# Patient Record
Sex: Female | Born: 1950
Health system: Southern US, Community
[De-identification: ages and names within clinical notes are randomized; demographics above are authoritative.]

## PROBLEM LIST (undated history)

## (undated) DIAGNOSIS — R7303 Prediabetes: Secondary | ICD-10-CM

## (undated) DIAGNOSIS — E785 Hyperlipidemia, unspecified: Secondary | ICD-10-CM

## (undated) HISTORY — DX: Prediabetes: R73.03

## (undated) HISTORY — DX: Hyperlipidemia, unspecified: E78.5

## (undated) HISTORY — PX: TUBAL LIGATION: SHX77

---

## 1998-12-08 ENCOUNTER — Ambulatory Visit (HOSPITAL_COMMUNITY): Admission: RE | Admit: 1998-12-08 | Discharge: 1998-12-08 | Payer: Self-pay | Admitting: Gastroenterology

## 1999-01-25 ENCOUNTER — Encounter: Payer: Self-pay | Admitting: Obstetrics & Gynecology

## 1999-01-25 ENCOUNTER — Ambulatory Visit (HOSPITAL_COMMUNITY): Admission: RE | Admit: 1999-01-25 | Discharge: 1999-01-25 | Payer: Self-pay | Admitting: Obstetrics & Gynecology

## 2000-02-13 ENCOUNTER — Other Ambulatory Visit: Admission: RE | Admit: 2000-02-13 | Discharge: 2000-02-13 | Payer: Self-pay | Admitting: Obstetrics and Gynecology

## 2000-02-19 ENCOUNTER — Ambulatory Visit (HOSPITAL_COMMUNITY): Admission: RE | Admit: 2000-02-19 | Discharge: 2000-02-19 | Payer: Self-pay | Admitting: Obstetrics and Gynecology

## 2000-02-19 ENCOUNTER — Encounter: Payer: Self-pay | Admitting: Obstetrics and Gynecology

## 2000-02-28 ENCOUNTER — Ambulatory Visit (HOSPITAL_COMMUNITY): Admission: RE | Admit: 2000-02-28 | Discharge: 2000-02-28 | Payer: Self-pay | Admitting: Obstetrics and Gynecology

## 2000-02-28 ENCOUNTER — Encounter: Payer: Self-pay | Admitting: Obstetrics and Gynecology

## 2001-05-14 ENCOUNTER — Encounter: Payer: Self-pay | Admitting: Obstetrics and Gynecology

## 2001-05-14 ENCOUNTER — Ambulatory Visit (HOSPITAL_COMMUNITY): Admission: RE | Admit: 2001-05-14 | Discharge: 2001-05-14 | Payer: Self-pay | Admitting: Obstetrics and Gynecology

## 2002-05-18 ENCOUNTER — Encounter: Payer: Self-pay | Admitting: Obstetrics and Gynecology

## 2002-05-18 ENCOUNTER — Ambulatory Visit (HOSPITAL_COMMUNITY): Admission: RE | Admit: 2002-05-18 | Discharge: 2002-05-18 | Payer: Self-pay | Admitting: Obstetrics and Gynecology

## 2003-06-17 ENCOUNTER — Ambulatory Visit (HOSPITAL_COMMUNITY): Admission: RE | Admit: 2003-06-17 | Discharge: 2003-06-17 | Payer: Self-pay | Admitting: Obstetrics and Gynecology

## 2003-06-17 ENCOUNTER — Encounter: Payer: Self-pay | Admitting: Obstetrics and Gynecology

## 2003-06-27 ENCOUNTER — Other Ambulatory Visit: Admission: RE | Admit: 2003-06-27 | Discharge: 2003-06-27 | Payer: Self-pay | Admitting: Obstetrics and Gynecology

## 2004-07-20 ENCOUNTER — Ambulatory Visit (HOSPITAL_COMMUNITY): Admission: RE | Admit: 2004-07-20 | Discharge: 2004-07-20 | Payer: Self-pay | Admitting: Obstetrics and Gynecology

## 2004-09-07 ENCOUNTER — Other Ambulatory Visit: Admission: RE | Admit: 2004-09-07 | Discharge: 2004-09-07 | Payer: Self-pay | Admitting: Obstetrics and Gynecology

## 2005-08-23 ENCOUNTER — Ambulatory Visit (HOSPITAL_COMMUNITY): Admission: RE | Admit: 2005-08-23 | Discharge: 2005-08-23 | Payer: Self-pay | Admitting: Obstetrics and Gynecology

## 2005-09-24 ENCOUNTER — Other Ambulatory Visit: Admission: RE | Admit: 2005-09-24 | Discharge: 2005-09-24 | Payer: Self-pay | Admitting: Obstetrics and Gynecology

## 2006-12-19 ENCOUNTER — Ambulatory Visit (HOSPITAL_COMMUNITY): Admission: RE | Admit: 2006-12-19 | Discharge: 2006-12-19 | Payer: Self-pay | Admitting: Obstetrics and Gynecology

## 2007-12-25 ENCOUNTER — Ambulatory Visit (HOSPITAL_COMMUNITY): Admission: RE | Admit: 2007-12-25 | Discharge: 2007-12-25 | Payer: Self-pay | Admitting: Obstetrics and Gynecology

## 2009-01-20 ENCOUNTER — Ambulatory Visit (HOSPITAL_COMMUNITY): Admission: RE | Admit: 2009-01-20 | Discharge: 2009-01-20 | Payer: Self-pay | Admitting: Obstetrics and Gynecology

## 2010-03-30 ENCOUNTER — Ambulatory Visit (HOSPITAL_COMMUNITY)
Admission: RE | Admit: 2010-03-30 | Discharge: 2010-03-30 | Payer: Self-pay | Source: Home / Self Care | Admitting: Obstetrics and Gynecology

## 2011-04-03 ENCOUNTER — Other Ambulatory Visit (HOSPITAL_COMMUNITY): Payer: Self-pay | Admitting: Obstetrics and Gynecology

## 2011-04-03 DIAGNOSIS — Z1231 Encounter for screening mammogram for malignant neoplasm of breast: Secondary | ICD-10-CM

## 2011-04-19 ENCOUNTER — Ambulatory Visit (HOSPITAL_COMMUNITY)
Admission: RE | Admit: 2011-04-19 | Discharge: 2011-04-19 | Disposition: A | Discharge status: Home / Self Care | Payer: PRIVATE HEALTH INSURANCE | Source: Ambulatory Visit | Attending: Obstetrics and Gynecology | Admitting: Obstetrics and Gynecology

## 2011-04-19 DIAGNOSIS — Z1231 Encounter for screening mammogram for malignant neoplasm of breast: Secondary | ICD-10-CM | POA: Insufficient documentation

## 2012-05-11 ENCOUNTER — Other Ambulatory Visit (HOSPITAL_COMMUNITY): Payer: Self-pay | Admitting: Obstetrics and Gynecology

## 2012-05-11 DIAGNOSIS — Z1231 Encounter for screening mammogram for malignant neoplasm of breast: Secondary | ICD-10-CM

## 2012-06-05 ENCOUNTER — Ambulatory Visit (HOSPITAL_COMMUNITY)
Admission: RE | Admit: 2012-06-05 | Discharge: 2012-06-05 | Disposition: A | Discharge status: Home / Self Care | Payer: BC Managed Care – PPO | Source: Ambulatory Visit | Attending: Obstetrics and Gynecology | Admitting: Obstetrics and Gynecology

## 2012-06-05 DIAGNOSIS — Z1231 Encounter for screening mammogram for malignant neoplasm of breast: Secondary | ICD-10-CM

## 2012-09-01 ENCOUNTER — Encounter: Payer: Self-pay | Admitting: Emergency Medicine

## 2012-09-01 ENCOUNTER — Ambulatory Visit (INDEPENDENT_AMBULATORY_CARE_PROVIDER_SITE_OTHER): Payer: BC Managed Care – PPO | Admitting: Emergency Medicine

## 2012-09-01 ENCOUNTER — Ambulatory Visit: Payer: BC Managed Care – PPO

## 2012-09-01 VITALS — BP 120/78 | HR 73 | Temp 98.1°F | Resp 17 | Ht 62.0 in | Wt 167.0 lb

## 2012-09-01 DIAGNOSIS — Z720 Tobacco use: Secondary | ICD-10-CM | POA: Insufficient documentation

## 2012-09-01 DIAGNOSIS — D219 Benign neoplasm of connective and other soft tissue, unspecified: Secondary | ICD-10-CM

## 2012-09-01 DIAGNOSIS — Z0000 Encounter for general adult medical examination without abnormal findings: Secondary | ICD-10-CM

## 2012-09-01 DIAGNOSIS — Z780 Asymptomatic menopausal state: Secondary | ICD-10-CM

## 2012-09-01 DIAGNOSIS — L739 Follicular disorder, unspecified: Secondary | ICD-10-CM

## 2012-09-01 DIAGNOSIS — F17200 Nicotine dependence, unspecified, uncomplicated: Secondary | ICD-10-CM | POA: Insufficient documentation

## 2012-09-01 DIAGNOSIS — E785 Hyperlipidemia, unspecified: Secondary | ICD-10-CM | POA: Insufficient documentation

## 2012-09-01 DIAGNOSIS — Z23 Encounter for immunization: Secondary | ICD-10-CM

## 2012-09-01 LAB — POCT UA - MICROSCOPIC ONLY
Casts, Ur, LPF, POC: NEGATIVE
Crystals, Ur, HPF, POC: NEGATIVE
Mucus, UA: NEGATIVE
RBC, urine, microscopic: NEGATIVE
Yeast, UA: NEGATIVE

## 2012-09-01 LAB — COMPREHENSIVE METABOLIC PANEL
ALT: 9 U/L (ref 0–35)
AST: 10 U/L (ref 0–37)
Albumin: 4.4 g/dL (ref 3.5–5.2)
Alkaline Phosphatase: 72 U/L (ref 39–117)
BUN: 11 mg/dL (ref 6–23)
CO2: 24 mEq/L (ref 19–32)
Calcium: 8.9 mg/dL (ref 8.4–10.5)
Chloride: 110 mEq/L (ref 96–112)
Creat: 0.79 mg/dL (ref 0.50–1.10)
Glucose, Bld: 92 mg/dL (ref 70–99)
Potassium: 4.3 mEq/L (ref 3.5–5.3)
Sodium: 141 mEq/L (ref 135–145)
Total Bilirubin: 0.4 mg/dL (ref 0.3–1.2)
Total Protein: 6.7 g/dL (ref 6.0–8.3)

## 2012-09-01 LAB — CBC WITH DIFFERENTIAL/PLATELET
Basophils Absolute: 0.1 10*3/uL (ref 0.0–0.1)
Basophils Relative: 1 % (ref 0–1)
Eosinophils Absolute: 0.2 10*3/uL (ref 0.0–0.7)
Eosinophils Relative: 2 % (ref 0–5)
HCT: 41.1 % (ref 36.0–46.0)
Hemoglobin: 14.4 g/dL (ref 12.0–15.0)
Lymphocytes Relative: 28 % (ref 12–46)
Lymphs Abs: 2.3 10*3/uL (ref 0.7–4.0)
MCH: 31.2 pg (ref 26.0–34.0)
MCHC: 35 g/dL (ref 30.0–36.0)
MCV: 89 fL (ref 78.0–100.0)
Monocytes Absolute: 0.6 10*3/uL (ref 0.1–1.0)
Monocytes Relative: 8 % (ref 3–12)
Neutro Abs: 5.1 10*3/uL (ref 1.7–7.7)
Neutrophils Relative %: 61 % (ref 43–77)
Platelets: 281 10*3/uL (ref 150–400)
RBC: 4.62 MIL/uL (ref 3.87–5.11)
RDW: 13.6 % (ref 11.5–15.5)
WBC: 8.3 10*3/uL (ref 4.0–10.5)

## 2012-09-01 LAB — LIPID PANEL
Cholesterol: 155 mg/dL (ref 0–200)
HDL: 43 mg/dL (ref >39)
LDL Cholesterol: 93 mg/dL (ref 0–99)
Total CHOL/HDL Ratio: 3.6 Ratio
Triglycerides: 94 mg/dL (ref <150)
VLDL: 19 mg/dL (ref 0–40)

## 2012-09-01 LAB — POCT URINALYSIS DIPSTICK
Bilirubin, UA: NEGATIVE
Blood, UA: NEGATIVE
Glucose, UA: NEGATIVE
Ketones, UA: NEGATIVE
Leukocytes, UA: NEGATIVE
Nitrite, UA: NEGATIVE
Protein, UA: NEGATIVE
Spec Grav, UA: 1.015
Urobilinogen, UA: 0.2
pH, UA: 6.5

## 2012-09-01 LAB — IFOBT (OCCULT BLOOD): IFOBT: NEGATIVE

## 2012-09-01 LAB — TSH: TSH: 3.35 u[IU]/mL (ref 0.350–4.500)

## 2012-09-01 MED ORDER — SIMVASTATIN 40 MG PO TABS: 40.0000 mg | ORAL_TABLET | Freq: Every evening | ORAL | Status: DC

## 2012-09-01 MED ORDER — MUPIROCIN CALCIUM 2 % EX CREA
TOPICAL_CREAM | Freq: Three times a day (TID) | CUTANEOUS | Status: DC
Start: 2012-09-01 — End: 2014-11-03

## 2012-09-01 MED ORDER — VARENICLINE TARTRATE 1 MG PO TABS: 1.0000 mg | ORAL_TABLET | Freq: Two times a day (BID) | ORAL | Status: DC

## 2012-09-01 MED ORDER — CONJ ESTROG-MEDROXYPROGEST ACE 0.3-1.5 MG PO TABS: 1.0000 | ORAL_TABLET | Freq: Every day | ORAL | Status: DC

## 2012-09-01 NOTE — Progress Notes (Addendum)
@[email or web address] @  Patient ID: Jennifer Marquez MRN: [id number], DOB: 1951/05/13, 61 y.o. Date of Encounter: 09/01/2012, 10:36 AM  Primary Physician: No primary provider on file.  Chief Complaint: Physical (CPE)  HPI: 61 y.o. y/o female with history of noted below here for CPE.  Doing well. No issues/complaints.  LMP:  Pap: MMG: Review of Systems:  Consitutional: No fever, chills, fatigue, night sweats, lymphadenopathy, or weight changes. Eyes: No visual changes, eye redness, or discharge. ENT/Mouth: Ears: No otalgia, tinnitus, hearing loss, discharge. Nose: No congestion, rhinorrhea, sinus pain, or epistaxis. Throat: No sore throat, post nasal drip, or teeth pain. Cardiovascular: No CP, palpitations, diaphoresis, DOE, edema, orthopnea, PND. Respiratory: She has a history of exercise-induced asthma she has Dulera to take but rarely takes it. As a pro-air inhaler to use as  Gastrointestinal: No anorexia, dysphagia, reflux, pain, nausea, vomiting, hematemesis, diarrhea, constipation, BRBPR, or melena. Breast: No discharge, pain, swelling, or mass. Genitourinary: No dysuria, frequency, urgency, hematuria, incontinence, nocturia, amenorrhea, vaginal discharge, pruritis, burning, abnormal bleeding, or pain. Musculoskeletal: No decreased ROM, myalgias, stiffness, joint swelling, or weakness. Skin: No rash, erythema, lesion changes, pain, warmth, jaundice, or pruritis. Neurological: No headache, dizziness, syncope, seizures, tremors, memory loss, coordination problems, or paresthesias. Psychological: No anxiety, depression, hallucinations, SI/HI. Endocrine: No fatigue, polydipsia, polyphagia, polyuria, or known diabetes. All other systems were reviewed and are otherwise negative.  History reviewed. No pertinent past medical history.   Past Surgical History  Procedure Date  . Tubal ligation     Home Meds:  Prior to Admission medications   Medication Sig Start Date End Date Taking?  Authorizing Provider  estrogen, conjugated,-medroxyprogesterone (PREMPRO) 0.45-1.5 MG per tablet Take 1 tablet by mouth daily.   Yes Historical Provider, MD  simvastatin (ZOCOR) 40 MG tablet Take 40 mg by mouth every evening.   Yes Historical Provider, MD    Allergies: Not on File  History   Social History  . Marital Status: Married    Spouse Name: N/A    Number of Children: N/A  . Years of Education: N/A   Occupational History  . Not on file.   Social History Main Topics  . Smoking status: Current Every Day Smoker -- 0.1 packs/day  . Smokeless tobacco: Never Used     Comment: would like to discuss  . Alcohol Use: No  . Drug Use: No  . Sexually Active: Not on file   Other Topics Concern  . Not on file   Social History Narrative  . No narrative on file    Family History  Problem Relation Age of Onset  . Hypertension Mother   . Cancer Father     Physical Exam  Blood pressure 120/78, pulse 73, temperature 98.1 F (36.7 C), temperature source Oral, resp. rate 17, height 5\' 2"  (1.575 m), weight 167 lb (75.751 kg), SpO2 98.00%., Body mass index is 30.54 kg/(m^2). General: Well developed, well nourished, in no acute distress. HEENT: Normocephalic, atraumatic. Conjunctiva pink, sclera non-icteric. Pupils 2 mm constricting to 1 mm, round, regular, and equally reactive to light and accomodation. EOMI. Internal auditory canal clear. TMs with good cone of light and without pathology. Nasal mucosa pink. Nares are without discharge. No sinus tenderness. Oral mucosa pink. Dentition normal . Pharynx without exudate.   Neck: Supple. Trachea midline. No thyromegaly. Full ROM. No lymphadenopathy. Lungs: Clear to auscultation bilaterally without wheezes, rales, or rhonchi. Breathing is of normal effort and unlabored. Cardiovascular: RRR with S1 S2. No murmurs, rubs, or gallops appreciated. Distal  pulses 2+ symmetrically. No carotid or abdominal bruits.  Breast: There are no masses  palpable nipples are normal. There is no retraction noted there no skin changes noted  Abdomen: Soft, non-tender, non-distended with normoactive bowel sounds. No hepatosplenomegaly or masses. No rebound/guarding. No CVA tenderness. Without hernias.  Genitourinary:  There areas of folliculitis in the perineal and mons pubis areas. The vulva appear normal uterus is enlarged approximately 12 weeks size and irregular. Cervix itself looks normal.   Musculoskeletal: Full range of motion and 5/5 strength throughout. Without swelling, atrophy, tenderness, crepitus, or warmth. Extremities without clubbing, cyanosis, or edema. Calves supple. Skin: Warm and moist. There are multiple small pimple areas in the vaginal area  Neuro: A+Ox3. CN II-XII grossly intact. Moves all extremities spontaneously. Full sensation throughout. Normal gait. DTR 2+ throughout upper and lower extremities. Finger to nose intact. Psych:  Responds to questions appropriately with a normal affect.    [facility] reading (PRIMARY) by  Dr Cleta Alberts no acute disease      Assessment/Plan:  61 y.o. y/o female here for CPE patient has five factors for heart disease. On the lower dose of hormone. She is given a prescription for Chantix to see if we can help get her to quit smoking. She is up-to-date on her mammograms routine labs and Pap were done. She was updated on her tetanus today. She's been referred to [name]. for his evaluation for heart disease  -  Signed, Earl Lites, MD 09/01/2012 10:36 AM

## 2012-09-02 ENCOUNTER — Telehealth: Payer: Self-pay | Admitting: Emergency Medicine

## 2012-09-02 LAB — PAP IG (IMAGE GUIDED)

## 2012-09-02 LAB — VITAMIN D 25 HYDROXY (VIT D DEFICIENCY, FRACTURES): Vit D, 25-Hydroxy: 16 ng/mL — ABNORMAL LOW (ref 30–89)

## 2012-09-02 NOTE — Telephone Encounter (Signed)
Please call patient and let her know there were scant cells seen on the Pap smear due to her being in menopause. There were no abnormal cells seen

## 2012-09-02 NOTE — Telephone Encounter (Signed)
Patient advised.

## 2012-09-23 ENCOUNTER — Other Ambulatory Visit: Payer: Self-pay | Admitting: Emergency Medicine

## 2012-09-23 ENCOUNTER — Ambulatory Visit: Payer: PRIVATE HEALTH INSURANCE | Admitting: Cardiovascular Disease

## 2012-12-01 ENCOUNTER — Telehealth: Payer: Self-pay

## 2012-12-01 NOTE — Telephone Encounter (Signed)
Patient says Dr. Cleta Alberts lowered the dosage on her hormone PREMPRO and she is experiencing Night Sweats would like it increased.  Wants to talk with nurse first.   Call  [phone]  [facility] on [facility]

## 2012-12-01 NOTE — Telephone Encounter (Signed)
I would prefer that she be on the lowest dose of estrogen possible because of her age and increased risk of heart disease and breast cancer. I would advise she continue to try the lower dose of estrogen for another 4-6 weeks. If she is still having problems at that time we can increase the dosage back on her estrogen

## 2012-12-01 NOTE — Telephone Encounter (Signed)
estrogen, conjugated,-medroxyprogesterone (PREMPRO) 0.45-1.5 MG per tablet was changed to estrogen, conjugated,-medroxyprogesterone (PREMPRO) 0.3-1.5 MG per tablet Please advise, do you want to change this back?

## 2012-12-02 NOTE — Telephone Encounter (Signed)
Called patient to advise Dr Cleta Alberts wants her to give this another try for 4 more weeks, she reluctantly agrees, states she has tried it before, could not take lower dose. She will be in touch if she continues with night sweats.

## 2013-01-22 ENCOUNTER — Other Ambulatory Visit: Payer: Self-pay | Admitting: Internal Medicine

## 2013-01-22 NOTE — Telephone Encounter (Signed)
Spoke w/pt to clarify what she needs. She stated that she does NOT need the Chantix Starter pack (she just finished that). She does need the continuing packs sent to pharmacy. Dr Cleta Alberts, can we send in the continuing packs for pt?

## 2013-01-22 NOTE — Telephone Encounter (Signed)
Yes it is fine to call in the regular pack of chantix. she is to be on 1 mg twice a day #60 with 1 refill

## 2013-01-28 MED ORDER — VARENICLINE TARTRATE 1 MG PO TABS: 1.0000 mg | ORAL_TABLET | Freq: Two times a day (BID) | ORAL | Status: DC

## 2013-01-28 NOTE — Addendum Note (Signed)
Addended by: Sheppard Plumber A on: 01/28/2013 01:44 PM   Modules accepted: Orders

## 2013-01-28 NOTE — Telephone Encounter (Signed)
Sent in Rx for continuing packs.

## 2013-04-07 ENCOUNTER — Telehealth: Payer: Self-pay

## 2013-04-07 NOTE — Telephone Encounter (Signed)
Called her to advise.  

## 2013-04-07 NOTE — Telephone Encounter (Signed)
DR.DAUB, PT WOULD LIKE TO INCREASE HER DOSAGE ON PREMPRO BECAUSE SHE IS STILL EXPERIENCING NIGHT SWEATS AND HOT FLASHES.  PHARMACY:[facility][phone]

## 2013-04-07 NOTE — Telephone Encounter (Signed)
It is not safe for her to increase the dose because of the increased risk of breast cancer  and heart disease. I would suggest she talk to the pharmacist about herbal alternatives to help with the symptoms she is having. I would also be happy to see her in the office and talk with her about alternatives but I do not think that increasing the dose on her hormone is safe

## 2013-06-24 ENCOUNTER — Other Ambulatory Visit: Payer: Self-pay | Admitting: Physician Assistant

## 2013-06-25 NOTE — Telephone Encounter (Signed)
Needs ov, labs 

## 2013-06-29 ENCOUNTER — Other Ambulatory Visit (HOSPITAL_COMMUNITY): Payer: Self-pay | Admitting: Obstetrics and Gynecology

## 2013-06-29 DIAGNOSIS — Z1231 Encounter for screening mammogram for malignant neoplasm of breast: Secondary | ICD-10-CM

## 2013-07-02 ENCOUNTER — Ambulatory Visit (HOSPITAL_COMMUNITY)
Admission: RE | Admit: 2013-07-02 | Discharge: 2013-07-02 | Disposition: A | Discharge status: Home / Self Care | Payer: BC Managed Care – PPO | Source: Ambulatory Visit | Attending: Obstetrics and Gynecology | Admitting: Obstetrics and Gynecology

## 2013-07-02 DIAGNOSIS — Z1231 Encounter for screening mammogram for malignant neoplasm of breast: Secondary | ICD-10-CM | POA: Insufficient documentation

## 2013-07-06 ENCOUNTER — Other Ambulatory Visit: Payer: Self-pay | Admitting: Obstetrics and Gynecology

## 2013-07-06 DIAGNOSIS — R928 Other abnormal and inconclusive findings on diagnostic imaging of breast: Secondary | ICD-10-CM

## 2013-07-20 ENCOUNTER — Other Ambulatory Visit: Payer: Self-pay | Admitting: Obstetrics and Gynecology

## 2013-07-20 ENCOUNTER — Ambulatory Visit
Admission: RE | Admit: 2013-07-20 | Discharge: 2013-07-20 | Disposition: A | Discharge status: Home / Self Care | Payer: BC Managed Care – PPO | Source: Ambulatory Visit | Attending: Obstetrics and Gynecology | Admitting: Obstetrics and Gynecology

## 2013-07-20 DIAGNOSIS — R928 Other abnormal and inconclusive findings on diagnostic imaging of breast: Secondary | ICD-10-CM

## 2013-07-20 DIAGNOSIS — R921 Mammographic calcification found on diagnostic imaging of breast: Secondary | ICD-10-CM

## 2013-07-27 ENCOUNTER — Ambulatory Visit
Admission: RE | Admit: 2013-07-27 | Discharge: 2013-07-27 | Disposition: A | Discharge status: Home / Self Care | Payer: BC Managed Care – PPO | Source: Ambulatory Visit | Attending: Obstetrics and Gynecology | Admitting: Obstetrics and Gynecology

## 2013-07-27 DIAGNOSIS — R921 Mammographic calcification found on diagnostic imaging of breast: Secondary | ICD-10-CM

## 2013-08-12 ENCOUNTER — Other Ambulatory Visit: Payer: Self-pay

## 2013-09-17 ENCOUNTER — Telehealth: Payer: Self-pay | Admitting: Emergency Medicine

## 2013-09-17 ENCOUNTER — Other Ambulatory Visit: Payer: Self-pay | Admitting: Emergency Medicine

## 2013-09-17 NOTE — Telephone Encounter (Signed)
Please call patient and advised her to make an appointment to see me at 104 for her yearly checkup.

## 2013-09-17 NOTE — Telephone Encounter (Signed)
Pt has appt sch for 10/26/13. Can we give her 2 mos RFs to cover until then? I have pended w/2.

## 2013-09-20 NOTE — Telephone Encounter (Signed)
She is scheduled to see you on 10/26/12 for her physical.

## 2013-09-21 ENCOUNTER — Other Ambulatory Visit: Payer: Self-pay | Admitting: Physician Assistant

## 2013-10-26 ENCOUNTER — Ambulatory Visit (INDEPENDENT_AMBULATORY_CARE_PROVIDER_SITE_OTHER): Payer: BC Managed Care – PPO | Admitting: Emergency Medicine

## 2013-10-26 ENCOUNTER — Encounter: Payer: Self-pay | Admitting: Emergency Medicine

## 2013-10-26 ENCOUNTER — Ambulatory Visit: Payer: BC Managed Care – PPO

## 2013-10-26 VITALS — BP 110/78 | HR 61 | Temp 98.7°F | Resp 16 | Ht 62.0 in | Wt 161.0 lb

## 2013-10-26 DIAGNOSIS — Z0000 Encounter for general adult medical examination without abnormal findings: Secondary | ICD-10-CM

## 2013-10-26 DIAGNOSIS — R319 Hematuria, unspecified: Secondary | ICD-10-CM

## 2013-10-26 DIAGNOSIS — Z136 Encounter for screening for cardiovascular disorders: Secondary | ICD-10-CM

## 2013-10-26 LAB — CBC WITH DIFFERENTIAL/PLATELET
Basophils Absolute: 0.1 10*3/uL (ref 0.0–0.1)
Basophils Relative: 1 % (ref 0–1)
Eosinophils Absolute: 0.2 10*3/uL (ref 0.0–0.7)
Eosinophils Relative: 2 % (ref 0–5)
HCT: 44 % (ref 36.0–46.0)
Hemoglobin: 14.8 g/dL (ref 12.0–15.0)
Lymphocytes Relative: 28 % (ref 12–46)
Lymphs Abs: 2.2 10*3/uL (ref 0.7–4.0)
MCH: 31 pg (ref 26.0–34.0)
MCHC: 33.6 g/dL (ref 30.0–36.0)
MCV: 92.1 fL (ref 78.0–100.0)
Monocytes Absolute: 0.5 10*3/uL (ref 0.1–1.0)
Monocytes Relative: 6 % (ref 3–12)
Neutro Abs: 5 10*3/uL (ref 1.7–7.7)
Neutrophils Relative %: 63 % (ref 43–77)
Platelets: 278 10*3/uL (ref 150–400)
RBC: 4.78 MIL/uL (ref 3.87–5.11)
RDW: 13.7 % (ref 11.5–15.5)
WBC: 7.9 10*3/uL (ref 4.0–10.5)

## 2013-10-26 LAB — POCT URINALYSIS DIPSTICK
Bilirubin, UA: NEGATIVE
Glucose, UA: NEGATIVE
Ketones, UA: NEGATIVE
Leukocytes, UA: NEGATIVE
Nitrite, UA: NEGATIVE
Protein, UA: NEGATIVE
Spec Grav, UA: 1.025
Urobilinogen, UA: 0.2
pH, UA: 5.5

## 2013-10-26 LAB — COMPLETE METABOLIC PANEL WITH GFR
ALT: 13 U/L (ref 0–35)
AST: 13 U/L (ref 0–37)
Albumin: 4.3 g/dL (ref 3.5–5.2)
Alkaline Phosphatase: 75 U/L (ref 39–117)
BUN: 16 mg/dL (ref 6–23)
CO2: 27 mEq/L (ref 19–32)
Calcium: 9.1 mg/dL (ref 8.4–10.5)
Chloride: 107 mEq/L (ref 96–112)
Creat: 0.88 mg/dL (ref 0.50–1.10)
GFR, Est African American: 81 mL/min
GFR, Est Non African American: 70 mL/min
Glucose, Bld: 92 mg/dL (ref 70–99)
Potassium: 4.6 mEq/L (ref 3.5–5.3)
Sodium: 141 mEq/L (ref 135–145)
Total Bilirubin: 0.6 mg/dL (ref 0.3–1.2)
Total Protein: 6.6 g/dL (ref 6.0–8.3)

## 2013-10-26 LAB — LIPID PANEL
Cholesterol: 145 mg/dL (ref 0–200)
HDL: 49 mg/dL (ref >39)
LDL Cholesterol: 77 mg/dL (ref 0–99)
Total CHOL/HDL Ratio: 3 Ratio
Triglycerides: 96 mg/dL (ref <150)
VLDL: 19 mg/dL (ref 0–40)

## 2013-10-26 LAB — POCT UA - MICROSCOPIC ONLY
Casts, Ur, LPF, POC: NEGATIVE
Crystals, Ur, HPF, POC: NEGATIVE
Yeast, UA: NEGATIVE

## 2013-10-26 LAB — IFOBT (OCCULT BLOOD): IFOBT: NEGATIVE

## 2013-10-26 MED ORDER — ATORVASTATIN CALCIUM 40 MG PO TABS: ORAL_TABLET | ORAL | Status: DC

## 2013-10-26 MED ORDER — CONJ ESTROG-MEDROXYPROGEST ACE 0.3-1.5 MG PO TABS: ORAL_TABLET | ORAL | Status: DC

## 2013-10-26 MED ORDER — VARENICLINE TARTRATE 0.5 MG X 11 & 1 MG X 42 PO MISC: ORAL | Status: DC

## 2013-10-26 NOTE — Progress Notes (Signed)
   Subjective:    Patient ID: Jennifer Marquez, female    DOB: 07/13/1951, 63 y.o.   MRN: [id number]  HPI    Review of Systems  Constitutional: Negative.   HENT: Negative.   Eyes: Negative.   Respiratory: Negative.   Cardiovascular: Negative.   Gastrointestinal: Negative.   Endocrine: Negative.   Genitourinary: Negative.   Musculoskeletal: Negative.   Skin: Negative.   Allergic/Immunologic: Negative.   Neurological: Negative.   Hematological: Negative.   Psychiatric/Behavioral: Negative.        Objective:   Physical Exam        Assessment & Plan:

## 2013-10-26 NOTE — Progress Notes (Addendum)
[place], [place]  [id number]   [phone]  This chart was scribed for Arlyss Queen, MD by Maree Erie, ED Scribe. The patient was seen in room 22.  Chief Complaint  Patient presents with  . Annual Exam   Patient ID: ANDERA CRANMER MRN: [id number], DOB: 21-Mar-1951, 63 y.o. Date of Encounter: 10/26/2013, 8:21 AM  Primary Physician: No primary provider on file.  Chief Complaint: Physical (CPE)  HPI: 63 y.o. y/o female with history of noted below here for CPE.  Doing well.  She states she is still smoking. She tried Chantix but it made her nauseous so she stopped using it. She is still smoking a half pack and also using an e-cigarette. She needs a new prescription for the starter Chantix pack because she would like to try it again. She has been smoking for about fifteen years. She reports taking her Lipitor compliantly. She is currently on hormones. She states that when she didn't take them for a week her menopausal symptoms came back, mainly night sweats. However, she is open to trying to take them every other day as an attempt to wean off the medication. She denies a family history of breast or uterine cancer. She denies any surgical history. She denies a history of heart problems. She has not been seen by a Cardiologist.  She received her shingles vaccination last year. She has received her flu vaccination this year.   Last colonoscopy: 2010. Her brother had a history of colon cancer. Has not had one recently because the colonoscopy was filed under diagnostic instead of screening so it was denied by insurance.  LMP:  Pap: 08/2012 MMG: September or October, had biopsy that came back normal, cleared until 2015  Review of Systems:  Consitutional: No fever, chills, fatigue, night sweats, lymphadenopathy, or weight changes. Eyes: No visual changes, eye redness, or discharge. ENT/Mouth: Ears: No otalgia, tinnitus, hearing loss, discharge. Nose: No congestion, rhinorrhea,  sinus pain, or epistaxis. Throat: No sore throat, post nasal drip, or teeth pain. Cardiovascular: No CP, palpitations, diaphoresis, DOE, edema, orthopnea, PND. Respiratory: No cough, shortness of breath, wheezing or chest tightness.  Gastrointestinal: No anorexia, dysphagia, reflux, pain, nausea, vomiting, hematemesis, diarrhea, constipation, BRBPR, or melena. Breast: No discharge, pain, swelling, or mass. Genitourinary: No dysuria, frequency, urgency, hematuria, incontinence, nocturia, amenorrhea, vaginal discharge, pruritis, burning, abnormal bleeding, or pain. Musculoskeletal: No decreased ROM, myalgias, stiffness, joint swelling, or weakness. Skin: No rash, erythema, lesion changes, pain, warmth, jaundice, or pruritis. Neurological: No headache, dizziness, syncope, seizures, tremors, memory loss, coordination problems, or paresthesias. Psychological: No anxiety, depression, hallucinations, SI/HI. Endocrine: No fatigue, polydipsia, polyphagia, polyuria, or known diabetes. All other systems were reviewed and are otherwise negative.  History reviewed. No pertinent past medical history.   Past Surgical History  Procedure Laterality Date  . Tubal ligation      Home Meds:  Prior to Admission medications   Medication Sig Start Date End Date Taking? Authorizing Provider  atorvastatin (LIPITOR) 40 MG tablet TAKE ONE TABLET BY MOUTH ONCE DAILY (NEED  VISIT,  LABS) 09/21/13  Yes [name], MD  PREMPRO 0.3-1.5 MG per tablet TAKE ONE TABLET BY MOUTH EVERY DAY 09/17/13  Yes [name], MD  mupirocin cream (BACTROBAN) 2 % Apply topically 3 (three) times daily. 09/01/12   [name], MD  simvastatin (ZOCOR) 40 MG tablet Take 1 tablet (40 mg total) by mouth every evening. 09/01/12   [name], MD  varenicline (CHANTIX CONTINUING MONTH PAK) 1 MG tablet Take 1 tablet (1 mg total) by mouth 2 (two) times daily. 01/28/13   [name], MD    Allergies: No Known Allergies  History    Social History  . Marital Status: Married    Spouse Name: N/A    Number of Children: N/A  . Years of Education: N/A   Occupational History  . Not on file.   Social History Main Topics  . Smoking status: Current Every Day Smoker -- 0.10 packs/day  . Smokeless tobacco: Never Used     Comment: would like to discuss  . Alcohol Use: No  . Drug Use: No  . Sexual Activity: Not on file   Other Topics Concern  . Not on file   Social History Narrative   High School   Works as A/P Development worker, community    Family History  Problem Relation Age of Onset  . Hypertension Mother   . Stroke Mother   . Cancer Father     Physical Exam  Blood pressure 110/78, pulse 61, temperature 98.7 F (37.1 C), temperature source Oral, resp. rate 16, height 5\' 2"  (1.575 m), weight 161 lb (73.029 kg), SpO2 96.00%., Body mass index is 29.44 kg/(m^2). General: Well developed, well nourished, in no acute distress. HEENT: Normocephalic, atraumatic. Conjunctiva pink, sclera non-icteric. Pupils 2 mm constricting to 1 mm, round, regular, and equally reactive to light and accomodation. EOMI. Internal auditory canal clear. TMs with good cone of light and without pathology. Nasal mucosa pink. Nares are without discharge. No sinus tenderness. Oral mucosa pink. Dentition normal. Pharynx without exudate.   Neck: Supple. Trachea midline. No thyromegaly. Full ROM. No lymphadenopathy. Lungs: Bilateral rales at bases. Breathing is of normal effort and unlabored. Cardiovascular: RRR with S1 S2. No murmurs, rubs, or gallops appreciated. Distal pulses 2+ symmetrically. No carotid or abdominal bruits. Breast:   Abdomen: Soft, non-tender, non-distended with normoactive bowel sounds. No hepatosplenomegaly or masses. No rebound/guarding. No CVA tenderness. Without hernias.  Genitourinary:  Normal cervix uterus is approximately 8 week size irregular nodularity of the fundus consistent with history of fibroids.     Musculoskeletal: Full range of motion and 5/5 strength throughout. Without swelling, atrophy, tenderness, crepitus, or warmth. Extremities without clubbing, cyanosis, or edema. Calves supple. Skin: Warm and moist without erythema, ecchymosis, wounds, or rash. Neuro: A+Ox3. CN II-XII grossly intact. Moves all extremities spontaneously. Full sensation throughout. Normal gait. DTR 2+ throughout upper and lower extremities. Finger to nose intact. Psych:  Responds to questions appropriately with a normal affect.     Results for orders placed in visit on 10/26/13  POCT UA - MICROSCOPIC ONLY      Result Value Range   WBC, Ur, HPF, POC 0-15     RBC, urine, microscopic 0-5     Bacteria, U Microscopic 2+     Mucus, UA trace     Epithelial cells, urine per micros 1-18     Crystals, Ur, HPF, POC neg     Casts, Ur, LPF, POC neg     Yeast, UA neg    POCT URINALYSIS DIPSTICK      Result Value Range   Color, UA yellow     Clarity, UA clear     Glucose, UA neg     Bilirubin, UA neg     Ketones, UA neg     Spec Grav, UA 1.025     Blood, UA trace     pH,  UA 5.5     Protein, UA neg     Urobilinogen, UA 0.2     Nitrite, UA neg     Leukocytes, UA Negative     [facility] reading (PRIMARY) by Dr. Everlene Farrier: Chest x-ray: prominent basilar markings. No masses seen.    Assessment/Plan:  63 y.o. y/o female here for CPE. We again discussed the risks of taking estrogens. She is well aware of this. She going to try and decrease to every other day. With hopes that in one year she will be up to stop the medication. She will be referred to Dr. Einar Gip. for cardiac eval.. she did have microscopic hematuria on her urinalysis and will see urologist for this because of her increased risk of bladder cancer due to smoking. urine culture was done.  -  I personally performed the services described in this documentation, which was scribed in my presence. The recorded information has been reviewed and is  accurate.   Signed, Nena Jordan, MD 10/26/2013 8:21 AM

## 2013-10-26 NOTE — Patient Instructions (Signed)
Smoking Cessation Quitting smoking is important to your health and has many advantages. However, it is not always easy to quit since nicotine is a very addictive drug. Often times, people try 3 times or more before being able to quit. This document explains the best ways for you to prepare to quit smoking. Quitting takes hard work and a lot of effort, but you can do it. ADVANTAGES OF QUITTING SMOKING  You will live longer, feel better, and live better.  Your body will feel the impact of quitting smoking almost immediately.  Within 20 minutes, blood pressure decreases. Your pulse returns to its normal level.  After 8 hours, carbon monoxide levels in the blood return to normal. Your oxygen level increases.  After 24 hours, the chance of having a heart attack starts to decrease. Your breath, hair, and body stop smelling like smoke.  After 48 hours, damaged nerve endings begin to recover. Your sense of taste and smell improve.  After 72 hours, the body is virtually free of nicotine. Your bronchial tubes relax and breathing becomes easier.  After 2 to 12 weeks, lungs can hold more air. Exercise becomes easier and circulation improves.  The risk of having a heart attack, stroke, cancer, or lung disease is greatly reduced.  After 1 year, the risk of coronary heart disease is cut in half.  After 5 years, the risk of stroke falls to the same as a nonsmoker.  After 10 years, the risk of lung cancer is cut in half and the risk of other cancers decreases significantly.  After 15 years, the risk of coronary heart disease drops, usually to the level of a nonsmoker.  If you are pregnant, quitting smoking will improve your chances of having a healthy baby.  The people you live with, especially any children, will be healthier.  You will have extra money to spend on things other than cigarettes. QUESTIONS TO THINK ABOUT BEFORE ATTEMPTING TO QUIT You may want to talk about your answers with your  caregiver.  Why do you want to quit?  If you tried to quit in the past, what helped and what did not?  What will be the most difficult situations for you after you quit? How will you plan to handle them?  Who can help you through the tough times? Your family? Friends? A caregiver?  What pleasures do you get from smoking? What ways can you still get pleasure if you quit? Here are some questions to ask your caregiver:  How can you help me to be successful at quitting?  What medicine do you think would be best for me and how should I take it?  What should I do if I need more help?  What is smoking withdrawal like? How can I get information on withdrawal? GET READY  Set a quit date.  Change your environment by getting rid of all cigarettes, ashtrays, matches, and lighters in your home, car, or work. Do not let people smoke in your home.  Review your past attempts to quit. Think about what worked and what did not. GET SUPPORT AND ENCOURAGEMENT You have a better chance of being successful if you have help. You can get support in many ways.  Tell your family, friends, and co-workers that you are going to quit and need their support. Ask them not to smoke around you.  Get individual, group, or telephone counseling and support. Programs are available at local hospitals and health centers. Call your local health department for   information about programs in your area.  Spiritual beliefs and practices may help some smokers quit.  Download a "quit meter" on your computer to keep track of quit statistics, such as how long you have gone without smoking, cigarettes not smoked, and money saved.  Get a self-help book about quitting smoking and staying off of tobacco. LEARN NEW SKILLS AND BEHAVIORS  Distract yourself from urges to smoke. Talk to someone, go for a walk, or occupy your time with a task.  Change your normal routine. Take a different route to work. Drink tea instead of coffee.  Eat breakfast in a different place.  Reduce your stress. Take a hot bath, exercise, or read a book.  Plan something enjoyable to do every day. Reward yourself for not smoking.  Explore interactive web-based programs that specialize in helping you quit. GET MEDICINE AND USE IT CORRECTLY Medicines can help you stop smoking and decrease the urge to smoke. Combining medicine with the above behavioral methods and support can greatly increase your chances of successfully quitting smoking.  Nicotine replacement therapy helps deliver nicotine to your body without the negative effects and risks of smoking. Nicotine replacement therapy includes nicotine gum, lozenges, inhalers, nasal sprays, and skin patches. Some may be available over-the-counter and others require a prescription.  Antidepressant medicine helps people abstain from smoking, but how this works is unknown. This medicine is available by prescription.  Nicotinic receptor partial agonist medicine simulates the effect of nicotine in your brain. This medicine is available by prescription. Ask your caregiver for advice about which medicines to use and how to use them based on your health history. Your caregiver will tell you what side effects to look out for if you choose to be on a medicine or therapy. Carefully read the information on the package. Do not use any other product containing nicotine while using a nicotine replacement product.  RELAPSE OR DIFFICULT SITUATIONS Most relapses occur within the first 3 months after quitting. Do not be discouraged if you start smoking again. Remember, most people try several times before finally quitting. You may have symptoms of withdrawal because your body is used to nicotine. You may crave cigarettes, be irritable, feel very hungry, cough often, get headaches, or have difficulty concentrating. The withdrawal symptoms are only temporary. They are strongest when you first quit, but they will go away within  10 14 days. To reduce the chances of relapse, try to:  Avoid drinking alcohol. Drinking lowers your chances of successfully quitting.  Reduce the amount of caffeine you consume. Once you quit smoking, the amount of caffeine in your body increases and can give you symptoms, such as a rapid heartbeat, sweating, and anxiety.  Avoid smokers because they can make you want to smoke.  Do not let weight gain distract you. Many smokers will gain weight when they quit, usually less than 10 pounds. Eat a healthy diet and stay active. You can always lose the weight gained after you quit.  Find ways to improve your mood other than smoking. FOR MORE INFORMATION  www.smokefree.gov  Document Released: 09/17/2001 Document Revised: 03/24/2012 Document Reviewed: 01/02/2012 [employer] Patient Information 2014 [employer].  

## 2013-10-27 LAB — TSH: TSH: 2.602 u[IU]/mL (ref 0.350–4.500)

## 2013-10-28 LAB — URINE CULTURE: Colony Count: 6000

## 2013-11-01 ENCOUNTER — Encounter: Payer: Self-pay | Admitting: Emergency Medicine

## 2013-11-18 NOTE — Telephone Encounter (Signed)
A user error has taken place: encounter opened in error, closed for administrative reasons.

## 2014-08-31 ENCOUNTER — Other Ambulatory Visit: Payer: Self-pay | Admitting: Emergency Medicine

## 2014-08-31 ENCOUNTER — Other Ambulatory Visit: Payer: Self-pay

## 2014-08-31 DIAGNOSIS — Z1231 Encounter for screening mammogram for malignant neoplasm of breast: Secondary | ICD-10-CM

## 2014-09-16 ENCOUNTER — Ambulatory Visit (HOSPITAL_COMMUNITY)
Admission: RE | Admit: 2014-09-16 | Discharge: 2014-09-16 | Disposition: A | Discharge status: Home / Self Care | Payer: BC Managed Care – PPO | Source: Ambulatory Visit | Attending: Emergency Medicine | Admitting: Emergency Medicine

## 2014-09-16 DIAGNOSIS — Z1231 Encounter for screening mammogram for malignant neoplasm of breast: Secondary | ICD-10-CM | POA: Diagnosis not present

## 2014-10-07 HISTORY — PX: BREAST BIOPSY: SHX20

## 2014-11-03 ENCOUNTER — Encounter: Payer: Self-pay | Admitting: Emergency Medicine

## 2014-11-03 ENCOUNTER — Ambulatory Visit (INDEPENDENT_AMBULATORY_CARE_PROVIDER_SITE_OTHER): Payer: BLUE CROSS/BLUE SHIELD | Admitting: Emergency Medicine

## 2014-11-03 ENCOUNTER — Ambulatory Visit (INDEPENDENT_AMBULATORY_CARE_PROVIDER_SITE_OTHER): Payer: BLUE CROSS/BLUE SHIELD

## 2014-11-03 VITALS — BP 125/75 | HR 80 | Temp 98.3°F | Resp 16 | Ht 62.0 in | Wt 159.0 lb

## 2014-11-03 DIAGNOSIS — E785 Hyperlipidemia, unspecified: Secondary | ICD-10-CM

## 2014-11-03 DIAGNOSIS — Z0000 Encounter for general adult medical examination without abnormal findings: Secondary | ICD-10-CM

## 2014-11-03 DIAGNOSIS — Z131 Encounter for screening for diabetes mellitus: Secondary | ICD-10-CM

## 2014-11-03 DIAGNOSIS — Z720 Tobacco use: Secondary | ICD-10-CM

## 2014-11-03 DIAGNOSIS — Z1211 Encounter for screening for malignant neoplasm of colon: Secondary | ICD-10-CM

## 2014-11-03 DIAGNOSIS — F17200 Nicotine dependence, unspecified, uncomplicated: Secondary | ICD-10-CM

## 2014-11-03 LAB — LIPID PANEL
Cholesterol: 149 mg/dL (ref 0–200)
HDL: 45 mg/dL (ref >39)
LDL Cholesterol: 83 mg/dL (ref 0–99)
Total CHOL/HDL Ratio: 3.3 Ratio
Triglycerides: 106 mg/dL (ref <150)
VLDL: 21 mg/dL (ref 0–40)

## 2014-11-03 LAB — COMPLETE METABOLIC PANEL WITH GFR
ALT: 11 U/L (ref 0–35)
AST: 13 U/L (ref 0–37)
Albumin: 4.2 g/dL (ref 3.5–5.2)
Alkaline Phosphatase: 70 U/L (ref 39–117)
BUN: 10 mg/dL (ref 6–23)
CO2: 22 mEq/L (ref 19–32)
Calcium: 9.7 mg/dL (ref 8.4–10.5)
Chloride: 107 mEq/L (ref 96–112)
Creat: 0.77 mg/dL (ref 0.50–1.10)
GFR, Est African American: >89 mL/min
GFR, Est Non African American: 82 mL/min
Glucose, Bld: 89 mg/dL (ref 70–99)
Potassium: 4.8 mEq/L (ref 3.5–5.3)
Sodium: 140 mEq/L (ref 135–145)
Total Bilirubin: 0.6 mg/dL (ref 0.2–1.2)
Total Protein: 6.7 g/dL (ref 6.0–8.3)

## 2014-11-03 LAB — CBC WITH DIFFERENTIAL/PLATELET
Basophils Absolute: 0.1 10*3/uL (ref 0.0–0.1)
Basophils Relative: 1 % (ref 0–1)
Eosinophils Absolute: 0.2 10*3/uL (ref 0.0–0.7)
Eosinophils Relative: 2 % (ref 0–5)
HCT: 43.5 % (ref 36.0–46.0)
Hemoglobin: 14.7 g/dL (ref 12.0–15.0)
Lymphocytes Relative: 27 % (ref 12–46)
Lymphs Abs: 2.2 10*3/uL (ref 0.7–4.0)
MCH: 30.4 pg (ref 26.0–34.0)
MCHC: 33.8 g/dL (ref 30.0–36.0)
MCV: 90.1 fL (ref 78.0–100.0)
MPV: 10.3 fL (ref 8.6–12.4)
Monocytes Absolute: 0.3 10*3/uL (ref 0.1–1.0)
Monocytes Relative: 4 % (ref 3–12)
Neutro Abs: 5.3 10*3/uL (ref 1.7–7.7)
Neutrophils Relative %: 66 % (ref 43–77)
Platelets: 286 10*3/uL (ref 150–400)
RBC: 4.83 MIL/uL (ref 3.87–5.11)
RDW: 13 % (ref 11.5–15.5)
WBC: 8.1 10*3/uL (ref 4.0–10.5)

## 2014-11-03 LAB — TSH: TSH: 2.02 u[IU]/mL (ref 0.350–4.500)

## 2014-11-03 LAB — POCT URINALYSIS DIPSTICK
Bilirubin, UA: NEGATIVE
Blood, UA: NEGATIVE
Glucose, UA: NEGATIVE
Ketones, UA: NEGATIVE
Leukocytes, UA: NEGATIVE
Nitrite, UA: NEGATIVE
Protein, UA: NEGATIVE
Spec Grav, UA: 1.015
Urobilinogen, UA: 0.2
pH, UA: 7

## 2014-11-03 LAB — HEMOGLOBIN A1C
Hgb A1c MFr Bld: 6 % — ABNORMAL HIGH (ref <5.7)
Mean Plasma Glucose: 126 mg/dL — ABNORMAL HIGH (ref <117)

## 2014-11-03 MED ORDER — SIMVASTATIN 40 MG PO TABS: 40.0000 mg | ORAL_TABLET | Freq: Every day | ORAL | Status: DC

## 2014-11-03 MED ORDER — ATORVASTATIN CALCIUM 40 MG PO TABS: ORAL_TABLET | ORAL | Status: DC

## 2014-11-03 MED ORDER — VARENICLINE TARTRATE 1 MG PO TABS: 1.0000 mg | ORAL_TABLET | Freq: Two times a day (BID) | ORAL | Status: DC

## 2014-11-03 MED ORDER — CONJ ESTROG-MEDROXYPROGEST ACE 0.3-1.5 MG PO TABS: ORAL_TABLET | ORAL | Status: DC

## 2014-11-03 NOTE — Progress Notes (Addendum)
Subjective:  This chart was scribed for Jennifer Russian, MD by Tamsen Roers, at [facility].  This patient was seen in room 22 and the patient's care was started at 8:34 AM.    Patient ID: Jennifer Marquez, female    DOB: 14-Dec-1950, 64 y.o.   MRN: [id number]  HPI  HPI Comments: Jennifer Marquez is a 64 y.o. female who presents to [facility] for an annual exam. Notes that she is doing well today.  Patient is taking her Lipitor for cholesterol and is on Estrogen. Patient is on [facility] and uses her medication less frequently in the cold weather. Patient started on a Chantix started pack for her smoking and notes she is not having any problems with it. She denies bleeding and had her last mammogram on December 1st 2015 and states that she gets it every year.  Her last papsmear was two years ago. Notes she has sinus problems but denies getting the flu and takes over the counter medication for relief.  Denies difficulty breathing.  Patient has had her tonsils taken out and a tubal ligation. Patient sees the opthalmologist twice a year.  Patient has not yet seen a Dealer.        Patient Active Problem List   Diagnosis Date Noted  . Menopause 09/01/2012  . Fibroids 09/01/2012  . Smoker 09/01/2012  . Hyperlipidemia 09/01/2012   History reviewed. No pertinent past medical history. Past Surgical History  Procedure Laterality Date  . Tubal ligation     No Known Allergies Prior to Admission medications   Medication Sig Start Date End Date Taking? Authorizing Provider  atorvastatin (LIPITOR) 40 MG tablet Take one tablet daily 10/26/13  Yes Jennifer Russian, MD  estrogen, conjugated,-medroxyprogesterone (PREMPRO) 0.3-1.5 MG per tablet TAKE ONE TABLET BY MOUTH EVERY DAY 10/26/13  Yes Jennifer Russian, MD  varenicline (CHANTIX STARTING MONTH PAK) 0.5 MG X 11 & 1 MG X 42 tablet Take one 0.5 mg tablet by mouth once daily for 3 days, then increase to one 0.5 mg  tablet twice daily for 4 days, then increase to one 1 mg tablet twice daily. 10/26/13  Yes Jennifer Russian, MD   History   Social History  . Marital Status: Married    Spouse Name: N/A    Number of Children: N/A  . Years of Education: N/A   Occupational History  . Not on file.   Social History Main Topics  . Smoking status: Current Every Day Smoker -- 0.10 packs/day  . Smokeless tobacco: Never Used     Comment: would like to discuss  . Alcohol Use: No  . Drug Use: No  . Sexual Activity: No   Other Topics Concern  . Not on file   Social History Narrative   High School   Works as A/P Development worker, community        Review of Systems  Constitutional: Negative for chills.  Respiratory: Negative for shortness of breath.        Objective:   Physical Exam  CONSTITUTIONAL: Well developed/well nourished HEAD: Normocephalic/atraumatic EYES: EOMI/PERRL ENMT: Mucous membranes moist NECK: supple no meningeal signs SPINE/BACK:entire spine nontender CV: S1/S2 noted, no murmurs/rubs/gallops noted LUNGS: Lungs are clear to auscultation bilaterally, no apparent distress ABDOMEN: soft, nontender, no rebound or guarding, bowel sounds noted throughout abdomen GU:no cva tenderness NEURO: Pt is awake/alert/appropriate, moves all extremitiesx4.  No facial droop.   EXTREMITIES: pulses normal/equal,  full ROM SKIN: warm, color normal-- she has a 1X1 cm raise non pigmented lesion left frontal scalp and a .5 X .5 raised lesion over the right eye lid.  PSYCH: no abnormalities of mood noted, alert and oriented to situation GU exam: uterus is enlarged lobular aproximately 8 weeks size, non tender.  cervix appears normal with no bleeding and there are no adnexal masses felt.  Results for orders placed or performed in visit on 11/03/14  POCT urinalysis dipstick  Result Value Ref Range   Color, UA yellow    Clarity, UA clear    Glucose, UA neg    Bilirubin, UA neg    Ketones, UA neg    Spec  Grav, UA 1.015    Blood, UA neg    pH, UA 7.0    Protein, UA neg    Urobilinogen, UA 0.2    Nitrite, UA neg    Leukocytes, UA Negative    EKG normal [facility] reading (PRIMARY) by  Dr. Everlene Farrier no acute disease      Filed Vitals:   11/03/14 0820  BP: 125/75  Pulse: 80  Temp: 98.3 F (36.8 C)  Resp: 16  Height: 5\' 2"  (1.575 m)  Weight: 159 lb (72.122 kg)  SpO2: 96%   Meds ordered this encounter  Medications  . varenicline (CHANTIX CONTINUING MONTH PAK) 1 MG tablet    Sig: Take 1 tablet (1 mg total) by mouth 2 (two) times daily.    Dispense:  60 tablet    Refill:  2  . estrogen, conjugated,-medroxyprogesterone (PREMPRO) 0.3-1.5 MG per tablet    Sig: TAKE ONE TABLET BY MOUTH EVERY DAY    Dispense:  30 tablet    Refill:  11  . atorvastatin (LIPITOR) 40 MG tablet    Sig: Take one tablet daily    Dispense:  30 tablet    Refill:  11  . DISCONTD: simvastatin (ZOCOR) 40 MG tablet    Sig: Take 1 tablet (40 mg total) by mouth at bedtime.    Dispense:  90 tablet    Refill:  3   Results for orders placed or performed in visit on 11/03/14  POCT urinalysis dipstick  Result Value Ref Range   Color, UA yellow    Clarity, UA clear    Glucose, UA neg    Bilirubin, UA neg    Ketones, UA neg    Spec Grav, UA 1.015    Blood, UA neg    pH, UA 7.0    Protein, UA neg    Urobilinogen, UA 0.2    Nitrite, UA neg    Leukocytes, UA Negative         Assessment & Plan:  Meds were refilled. Again she was cautioned regarding her use of Prempro. She is going to try and cut this back to every 3 days instead of every other day. She understands the risks of rest cancer and endometrial cancer and cardiac disease. She is a smoker and currently on Chantix to quit. Her medications were refilled. She is on cholesterol medication and Prempro and smokes and so referral was made to cardiology for their opinion regarding screening.I personally performed the services described in this documentation, which was  scribed in my presence. The recorded information has been reviewed and is accurate.

## 2014-11-04 LAB — PAP IG (IMAGE GUIDED)

## 2014-11-18 ENCOUNTER — Encounter: Payer: Self-pay | Admitting: Emergency Medicine

## 2014-12-01 ENCOUNTER — Encounter: Payer: Self-pay | Admitting: Emergency Medicine

## 2014-12-09 ENCOUNTER — Institutional Professional Consult (permissible substitution): Payer: Self-pay | Admitting: Internal Medicine

## 2015-01-12 ENCOUNTER — Other Ambulatory Visit: Payer: Self-pay

## 2015-01-12 NOTE — Telephone Encounter (Signed)
Pharm reqs RF of Chantix continuing pack. Do you want to give RFs?

## 2015-01-13 MED ORDER — VARENICLINE TARTRATE 1 MG PO TABS: 1.0000 mg | ORAL_TABLET | Freq: Two times a day (BID) | ORAL | Status: DC

## 2015-04-03 ENCOUNTER — Other Ambulatory Visit: Payer: Self-pay

## 2015-09-07 ENCOUNTER — Other Ambulatory Visit: Payer: Self-pay

## 2015-09-07 DIAGNOSIS — Z1231 Encounter for screening mammogram for malignant neoplasm of breast: Secondary | ICD-10-CM

## 2015-10-10 ENCOUNTER — Encounter: Payer: Self-pay | Admitting: Emergency Medicine

## 2015-10-11 ENCOUNTER — Ambulatory Visit (INDEPENDENT_AMBULATORY_CARE_PROVIDER_SITE_OTHER): Payer: BLUE CROSS/BLUE SHIELD

## 2015-10-11 ENCOUNTER — Ambulatory Visit (INDEPENDENT_AMBULATORY_CARE_PROVIDER_SITE_OTHER): Payer: BLUE CROSS/BLUE SHIELD | Admitting: Emergency Medicine

## 2015-10-11 VITALS — BP 128/74 | HR 88 | Temp 98.1°F | Resp 16 | Ht 62.0 in | Wt 163.6 lb

## 2015-10-11 DIAGNOSIS — R05 Cough: Secondary | ICD-10-CM

## 2015-10-11 DIAGNOSIS — R059 Cough, unspecified: Secondary | ICD-10-CM

## 2015-10-11 LAB — POCT CBC
Granulocyte percent: 64.2 % (ref 37–80)
HCT, POC: 38.4 % (ref 37.7–47.9)
Hemoglobin: 13.3 g/dL (ref 12.2–16.2)
Lymph, poc: 1.8 (ref 0.6–3.4)
MCH, POC: 30.7 pg (ref 27–31.2)
MCHC: 34.6 g/dL (ref 31.8–35.4)
MCV: 88.9 fL (ref 80–97)
MID (cbc): 0.5 (ref 0–0.9)
MPV: 7.4 fL (ref 0–99.8)
POC Granulocyte: 4 (ref 2–6.9)
POC LYMPH PERCENT: 28.5 % (ref 10–50)
POC MID %: 7.3 % (ref 0–12)
Platelet Count, POC: 229 10*3/uL (ref 142–424)
RBC: 4.31 M/uL (ref 4.04–5.48)
RDW, POC: 13.2 %
WBC: 6.3 10*3/uL (ref 4.6–10.2)

## 2015-10-11 LAB — POCT INFLUENZA A/B
Influenza A, POC: NEGATIVE
Influenza B, POC: NEGATIVE

## 2015-10-11 MED ORDER — HYDROCODONE-HOMATROPINE 5-1.5 MG/5ML PO SYRP: 5.0000 mL | ORAL_SOLUTION | Freq: Three times a day (TID) | ORAL | Status: DC | PRN

## 2015-10-11 MED ORDER — BENZONATATE 100 MG PO CAPS: 100.0000 mg | ORAL_CAPSULE | Freq: Three times a day (TID) | ORAL | Status: DC | PRN

## 2015-10-11 NOTE — Progress Notes (Signed)
Patient ID: Jennifer Marquez, female   DOB: 09-May-1951, 65 y.o.   MRN: [id number]     By signing my name below, I, Jennifer Marquez, attest that this documentation has been prepared under the direction and in the presence of Arlyss Queen, MD.  Electronically Signed: Zola Marquez, Medical Scribe. 10/11/2015. 2:17 PM.   Chief Complaint:  Chief Complaint  Patient presents with  . congestion    x sunday  . Cough    x sunday  . Generalized Body Aches    x sunday  . noticed she felt warm like she had a fever    HPI: Jennifer Marquez is a 65 y.o. female who reports to [facility] today complaining of gradual onset, dry cough that started 3 days ago. Patient reports having associated sinus pressure, sneezing, watery eyes, generalized myalgias, congestion, rhinorrhea with clear discharge, and subjective fever. She has tried Dayquil, Nyquil, and other OTC treatments. She notes that several of her co-workers have been ill with similar symptoms. Patient denies sore throat.  Patient works at [employer].  History reviewed. No pertinent past medical history. Past Surgical History  Procedure Laterality Date  . Tubal ligation     Social History   Social History  . Marital Status: Married    Spouse Name: N/A  . Number of Children: N/A  . Years of Education: N/A   Social History Main Topics  . Smoking status: Current Every Day Smoker -- 0.10 packs/day  . Smokeless tobacco: Never Used     Comment: would like to discuss  . Alcohol Use: No  . Drug Use: No  . Sexual Activity: No   Other Topics Concern  . None   Social History Narrative   High School   Works as A/P Development worker, community   Family History  Problem Relation Age of Onset  . Hypertension Mother   . Stroke Mother   . Cancer Father   . Cancer Brother     colon cancer  . Heart disease Brother    No Known Allergies Prior to Admission medications   Medication Sig Start Date End Date Taking? Authorizing Provider    atorvastatin (LIPITOR) 40 MG tablet Take one tablet daily 11/03/14  Yes [name], MD  estrogen, conjugated,-medroxyprogesterone (PREMPRO) 0.3-1.5 MG per tablet TAKE ONE TABLET BY MOUTH EVERY DAY 11/03/14  Yes [name], MD  varenicline (CHANTIX CONTINUING MONTH PAK) 1 MG tablet Take 1 tablet (1 mg total) by mouth 2 (two) times daily. Patient not taking: Reported on 10/11/2015 01/13/15   [name], MD     ROS: The patient denies chills, night sweats, unintentional weight loss, chest pain, palpitations, wheezing, dyspnea on exertion, nausea, vomiting, abdominal pain, dysuria, hematuria, melena, numbness, weakness, or tingling.   All other systems have been reviewed and were otherwise negative with the exception of those mentioned in the HPI and as above.    PHYSICAL EXAM: Filed Vitals:   10/11/15 1349  BP: 128/74  Pulse: 88  Temp: 98.1 F (36.7 C)  Resp: 16   Body mass index is 29.92 kg/(m^2).   General: Alert, no acute distress HEENT:  Normocephalic, atraumatic, oropharynx patent. Ears are dull. Nose congested. Throat slightly red. Eye: [facility] Cardiovascular:  Regular rate and rhythm, no rubs murmurs or gallops.  No Carotid bruits, radial pulse intact. No pedal edema.  Respiratory: Occasional rhonchi, right greater than left. Abdominal: No organomegaly, abdomen is soft and non-tender, positive bowel sounds.  No masses. Musculoskeletal: Gait intact. No edema, tenderness Skin: No rashes. Neurologic: Facial musculature symmetric. Psychiatric: Patient acts appropriately throughout our interaction. Lymphatic: No cervical or submandibular lymphadenopathy     LABS: Results for orders placed or performed in visit on 10/11/15  POCT Influenza A/B  Result Value Ref Range   Influenza A, POC Negative Negative   Influenza B, POC Negative Negative  POCT CBC  Result Value Ref Range   WBC 6.3 4.6 - 10.2 K/uL   Lymph, poc 1.8 0.6 - 3.4   POC LYMPH PERCENT 28.5 10 - 50 %L    MID (cbc) 0.5 0 - 0.9   POC MID % 7.3 0 - 12 %M   POC Granulocyte 4.0 2 - 6.9   Granulocyte percent 64.2 37 - 80 %G   RBC 4.31 4.04 - 5.48 M/uL   Hemoglobin 13.3 12.2 - 16.2 g/dL   HCT, POC 38.4 37.7 - 47.9 %   MCV 88.9 80 - 97 fL   MCH, POC 30.7 27 - 31.2 pg   MCHC 34.6 31.8 - 35.4 g/dL   RDW, POC 13.2 %   Platelet Count, POC 229 142 - 424 K/uL   MPV 7.4 0 - 99.8 fL     EKG/XRAY:   Primary read interpreted by Dr. Everlene Farrier at [facility] are no pneumonic infiltrates   ASSESSMENT/PLAN: We'll treat as acute viral il. She will be treated with Hycodan and Tessalon Perles.I personally performed the services described in this documentation, which was scribed in my presence. The recorded information has been reviewed and is accurate.    Gross sideeffects, risk and benefits, and alternatives of medications d/w patient. Patient is aware that all medications have potential sideeffects and we are unable to predict every sideeffect or drug-drug interaction that may occur.  Arlyss Queen MD 10/11/2015 2:17 PM

## 2015-10-13 ENCOUNTER — Ambulatory Visit: Payer: Self-pay

## 2015-10-20 ENCOUNTER — Ambulatory Visit: Payer: Self-pay

## 2015-10-22 ENCOUNTER — Telehealth: Payer: Self-pay | Admitting: Family Medicine

## 2015-10-22 NOTE — Telephone Encounter (Signed)
lmom to call and reschedule appt with daub 

## 2015-10-25 ENCOUNTER — Other Ambulatory Visit: Payer: Self-pay | Admitting: Emergency Medicine

## 2015-10-25 ENCOUNTER — Encounter: Payer: Self-pay | Admitting: Emergency Medicine

## 2015-10-25 DIAGNOSIS — F17200 Nicotine dependence, unspecified, uncomplicated: Secondary | ICD-10-CM

## 2015-10-25 MED ORDER — VARENICLINE TARTRATE 1 MG PO TABS: 1.0000 mg | ORAL_TABLET | Freq: Two times a day (BID) | ORAL | Status: DC

## 2015-11-02 ENCOUNTER — Ambulatory Visit
Admission: RE | Admit: 2015-11-02 | Discharge: 2015-11-02 | Disposition: A | Discharge status: Home / Self Care | Payer: BLUE CROSS/BLUE SHIELD | Source: Ambulatory Visit

## 2015-11-02 DIAGNOSIS — Z1231 Encounter for screening mammogram for malignant neoplasm of breast: Secondary | ICD-10-CM

## 2015-11-21 ENCOUNTER — Other Ambulatory Visit: Payer: Self-pay | Admitting: Emergency Medicine

## 2015-12-28 ENCOUNTER — Encounter: Payer: Self-pay | Admitting: Emergency Medicine

## 2015-12-28 ENCOUNTER — Other Ambulatory Visit: Payer: Self-pay | Admitting: Emergency Medicine

## 2015-12-28 ENCOUNTER — Ambulatory Visit (INDEPENDENT_AMBULATORY_CARE_PROVIDER_SITE_OTHER): Payer: BLUE CROSS/BLUE SHIELD | Admitting: Emergency Medicine

## 2015-12-28 VITALS — BP 118/64 | HR 67 | Temp 97.8°F | Resp 16 | Ht 62.0 in | Wt 164.0 lb

## 2015-12-28 DIAGNOSIS — Z23 Encounter for immunization: Secondary | ICD-10-CM

## 2015-12-28 DIAGNOSIS — Z720 Tobacco use: Secondary | ICD-10-CM

## 2015-12-28 DIAGNOSIS — E785 Hyperlipidemia, unspecified: Secondary | ICD-10-CM | POA: Diagnosis not present

## 2015-12-28 DIAGNOSIS — Z0000 Encounter for general adult medical examination without abnormal findings: Secondary | ICD-10-CM | POA: Diagnosis not present

## 2015-12-28 DIAGNOSIS — F17200 Nicotine dependence, unspecified, uncomplicated: Secondary | ICD-10-CM

## 2015-12-28 DIAGNOSIS — Z131 Encounter for screening for diabetes mellitus: Secondary | ICD-10-CM

## 2015-12-28 LAB — POC MICROSCOPIC URINALYSIS (UMFC): Mucus: ABSENT

## 2015-12-28 LAB — CBC WITH DIFFERENTIAL/PLATELET
Basophils Absolute: 0.1 10*3/uL (ref 0.0–0.1)
Basophils Relative: 1 % (ref 0–1)
Eosinophils Absolute: 0.1 10*3/uL (ref 0.0–0.7)
Eosinophils Relative: 2 % (ref 0–5)
HCT: 42.7 % (ref 36.0–46.0)
Hemoglobin: 14.4 g/dL (ref 12.0–15.0)
Lymphocytes Relative: 13 % (ref 12–46)
Lymphs Abs: 0.9 10*3/uL (ref 0.7–4.0)
MCH: 30.2 pg (ref 26.0–34.0)
MCHC: 33.7 g/dL (ref 30.0–36.0)
MCV: 89.5 fL (ref 78.0–100.0)
MPV: 10.4 fL (ref 8.6–12.4)
Monocytes Absolute: 0.4 10*3/uL (ref 0.1–1.0)
Monocytes Relative: 6 % (ref 3–12)
Neutro Abs: 5.7 10*3/uL (ref 1.7–7.7)
Neutrophils Relative %: 78 % — ABNORMAL HIGH (ref 43–77)
Platelets: 273 10*3/uL (ref 150–400)
RBC: 4.77 MIL/uL (ref 3.87–5.11)
RDW: 13.1 % (ref 11.5–15.5)
WBC: 7.3 10*3/uL (ref 4.0–10.5)

## 2015-12-28 LAB — COMPLETE METABOLIC PANEL WITH GFR
ALT: 25 U/L (ref 6–29)
AST: 22 U/L (ref 10–35)
Albumin: 4.2 g/dL (ref 3.6–5.1)
Alkaline Phosphatase: 74 U/L (ref 33–130)
BUN: 12 mg/dL (ref 7–25)
CO2: 25 mmol/L (ref 20–31)
Calcium: 9.5 mg/dL (ref 8.6–10.4)
Chloride: 104 mmol/L (ref 98–110)
Creat: 0.79 mg/dL (ref 0.50–0.99)
GFR, Est African American: >89 mL/min (ref >=60)
GFR, Est Non African American: 78 mL/min (ref >=60)
Glucose, Bld: 89 mg/dL (ref 65–99)
Potassium: 4.2 mmol/L (ref 3.5–5.3)
Sodium: 139 mmol/L (ref 135–146)
Total Bilirubin: 0.8 mg/dL (ref 0.2–1.2)
Total Protein: 7 g/dL (ref 6.1–8.1)

## 2015-12-28 LAB — POCT URINALYSIS DIP (MANUAL ENTRY)
Bilirubin, UA: NEGATIVE
Blood, UA: NEGATIVE
Glucose, UA: NEGATIVE
Ketones, POC UA: NEGATIVE
Leukocytes, UA: NEGATIVE
Nitrite, UA: NEGATIVE
Protein Ur, POC: NEGATIVE
Spec Grav, UA: 1.02
Urobilinogen, UA: 0.2
pH, UA: 5.5

## 2015-12-28 LAB — LIPID PANEL
Cholesterol: 151 mg/dL (ref 125–200)
HDL: 53 mg/dL (ref >=46)
LDL Cholesterol: 83 mg/dL (ref <130)
Total CHOL/HDL Ratio: 2.8 Ratio (ref <=5.0)
Triglycerides: 77 mg/dL (ref <150)
VLDL: 15 mg/dL (ref <30)

## 2015-12-28 LAB — TSH: TSH: 1.74 mIU/L

## 2015-12-28 MED ORDER — ATORVASTATIN CALCIUM 40 MG PO TABS: 40.0000 mg | ORAL_TABLET | Freq: Every day | ORAL | Status: DC

## 2015-12-28 MED ORDER — CONJ ESTROG-MEDROXYPROGEST ACE 0.3-1.5 MG PO TABS: 1.0000 | ORAL_TABLET | Freq: Every day | ORAL | Status: DC

## 2015-12-28 NOTE — Progress Notes (Signed)
Patient ID: Jennifer Marquez, female   DOB: 1951-01-07, 65 y.o.   MRN: [id number]     By signing my name below, I, Zola Button, attest that this documentation has been prepared under the direction and in the presence of Arlyss Queen, MD.  Electronically Signed: Zola Button, Medical Scribe. 12/28/2015. 9:29 AM.   Chief Complaint:  Chief Complaint  Patient presents with  . Annual Exam  . Medication Refill    HPI: Jennifer Marquez is a 65 y.o. female who reports to [facility] today for an annual exam. Patient has been doing well overall. No new concerns. She is on Chantix and has been working on quitting smoking. Patient currently smokes 3-5 cigarettes a day. She went to her eye doctor last week. She has not had the Prevnar. Patient walks for exercise.  Patient does not see a gynecologist. Her last pap test was done by me last year. She is s/p tubal ligation and has all of her reproductive organs.  Patient works at [employer].  History reviewed. No pertinent past medical history. Past Surgical History  Procedure Laterality Date  . Tubal ligation     Social History   Social History  . Marital Status: Married    Spouse Name: N/A  . Number of Children: N/A  . Years of Education: N/A   Occupational History  . accountant    Social History Main Topics  . Smoking status: Current Every Day Smoker -- 0.10 packs/day  . Smokeless tobacco: Never Used     Comment: would like to discuss  . Alcohol Use: No  . Drug Use: No  . Sexual Activity: No   Other Topics Concern  . None   Social History Narrative   High School   Works as A/P Development worker, community   Family History  Problem Relation Age of Onset  . Hypertension Mother   . Stroke Mother   . Cancer Father   . Cancer Brother     colon cancer  . Heart disease Brother    No Known Allergies Prior to Admission medications   Medication Sig Start Date End Date Taking? Authorizing Provider  atorvastatin (LIPITOR) 40 MG tablet TAKE  ONE TABLET BY MOUTH ONCE DAILY 11/22/15  Yes [name], MD  PREMPRO 0.3-1.5 MG tablet TAKE ONE TABLET BY MOUTH ONCE DAILY 11/22/15  Yes [name], MD  varenicline (CHANTIX CONTINUING MONTH PAK) 1 MG tablet Take 1 tablet (1 mg total) by mouth 2 (two) times daily. 10/25/15  Yes [name], MD     ROS: The patient denies fevers, chills, night sweats, unintentional weight loss, chest pain, palpitations, wheezing, dyspnea on exertion, nausea, vomiting, abdominal pain, dysuria, hematuria, melena, numbness, weakness, or tingling.   All other systems have been reviewed and were otherwise negative with the exception of those mentioned in the HPI and as above.    PHYSICAL EXAM: Filed Vitals:   12/28/15 0911  BP: 118/64  Pulse: 67  Temp: 97.8 F (36.6 C)  Resp: 16   Body mass index is 29.99 kg/(m^2).   General: Alert, no acute distress HEENT:  Normocephalic, atraumatic, oropharynx patent. Eye: [facility] Cardiovascular:  Regular rate and rhythm, no rubs murmurs or gallops.  No Carotid bruits, radial pulse intact. No pedal edema.  Respiratory: Clear to auscultation bilaterally.  No wheezes, rales, or rhonchi.  No cyanosis, no use of accessory musculature Abdominal: No organomegaly, abdomen is soft and non-tender, positive bowel sounds.  No masses.  Musculoskeletal: Gait intact. No edema, tenderness Skin: No rashes. Neurologic: Facial musculature symmetric. Psychiatric: Patient acts appropriately throughout our interaction. Lymphatic: No cervical or submandibular lymphadenopathy Genitourinary: No pelvic exam performed.   LABS:    EKG/XRAY:   Primary read interpreted by Dr. Everlene Farrier at [facility].   ASSESSMENT/PLAN: Patient continues to work on getting off of cigarettes. Chantix  helping. We'll continue her current medications. She was given Prevnar today. Next year she can have a booster for pneumo 23.I personally performed the services described in this documentation, which was  scribed in my presence. The recorded information has been reviewed and is accurate.    Gross sideeffects, risk and benefits, and alternatives of medications d/w patient. Patient is aware that all medications have potential sideeffects and we are unable to predict every sideeffect or drug-drug interaction that may occur.  Arlyss Queen MD 12/28/2015 9:29 AM

## 2015-12-28 NOTE — Patient Instructions (Signed)
     IF you received an x-ray today, you will receive an invoice from [facility]. Please contact [facility] at [phone] with questions or concerns regarding your invoice.   IF you received labwork today, you will receive an invoice from [facility]. Please contact [facility] at [phone] with questions or concerns regarding your invoice.   Our billing staff will not be able to assist you with questions regarding bills from these companies.  You will be contacted with the lab results as soon as they are available. The fastest way to get your results is to activate your My Chart account. Instructions are located on the last page of this paperwork. If you have not heard from us regarding the results in 2 weeks, please contact this office.      

## 2015-12-29 ENCOUNTER — Telehealth: Payer: Self-pay | Admitting: Emergency Medicine

## 2015-12-29 LAB — HEMOGLOBIN A1C
Hgb A1c MFr Bld: 5.9 % — ABNORMAL HIGH (ref <5.7)
Mean Plasma Glucose: 123 mg/dL — ABNORMAL HIGH (ref <117)

## 2015-12-29 NOTE — Telephone Encounter (Signed)
Call patient. Labs are good. No changes.

## 2015-12-30 IMAGING — CR DG CHEST 2V
2 series · 2 of 2 positions shown · non-contrast
Comparison: 10/26/2013

CLINICAL DATA: Annual exam, smoker

EXAM:
CHEST  2 VIEW

[PA]
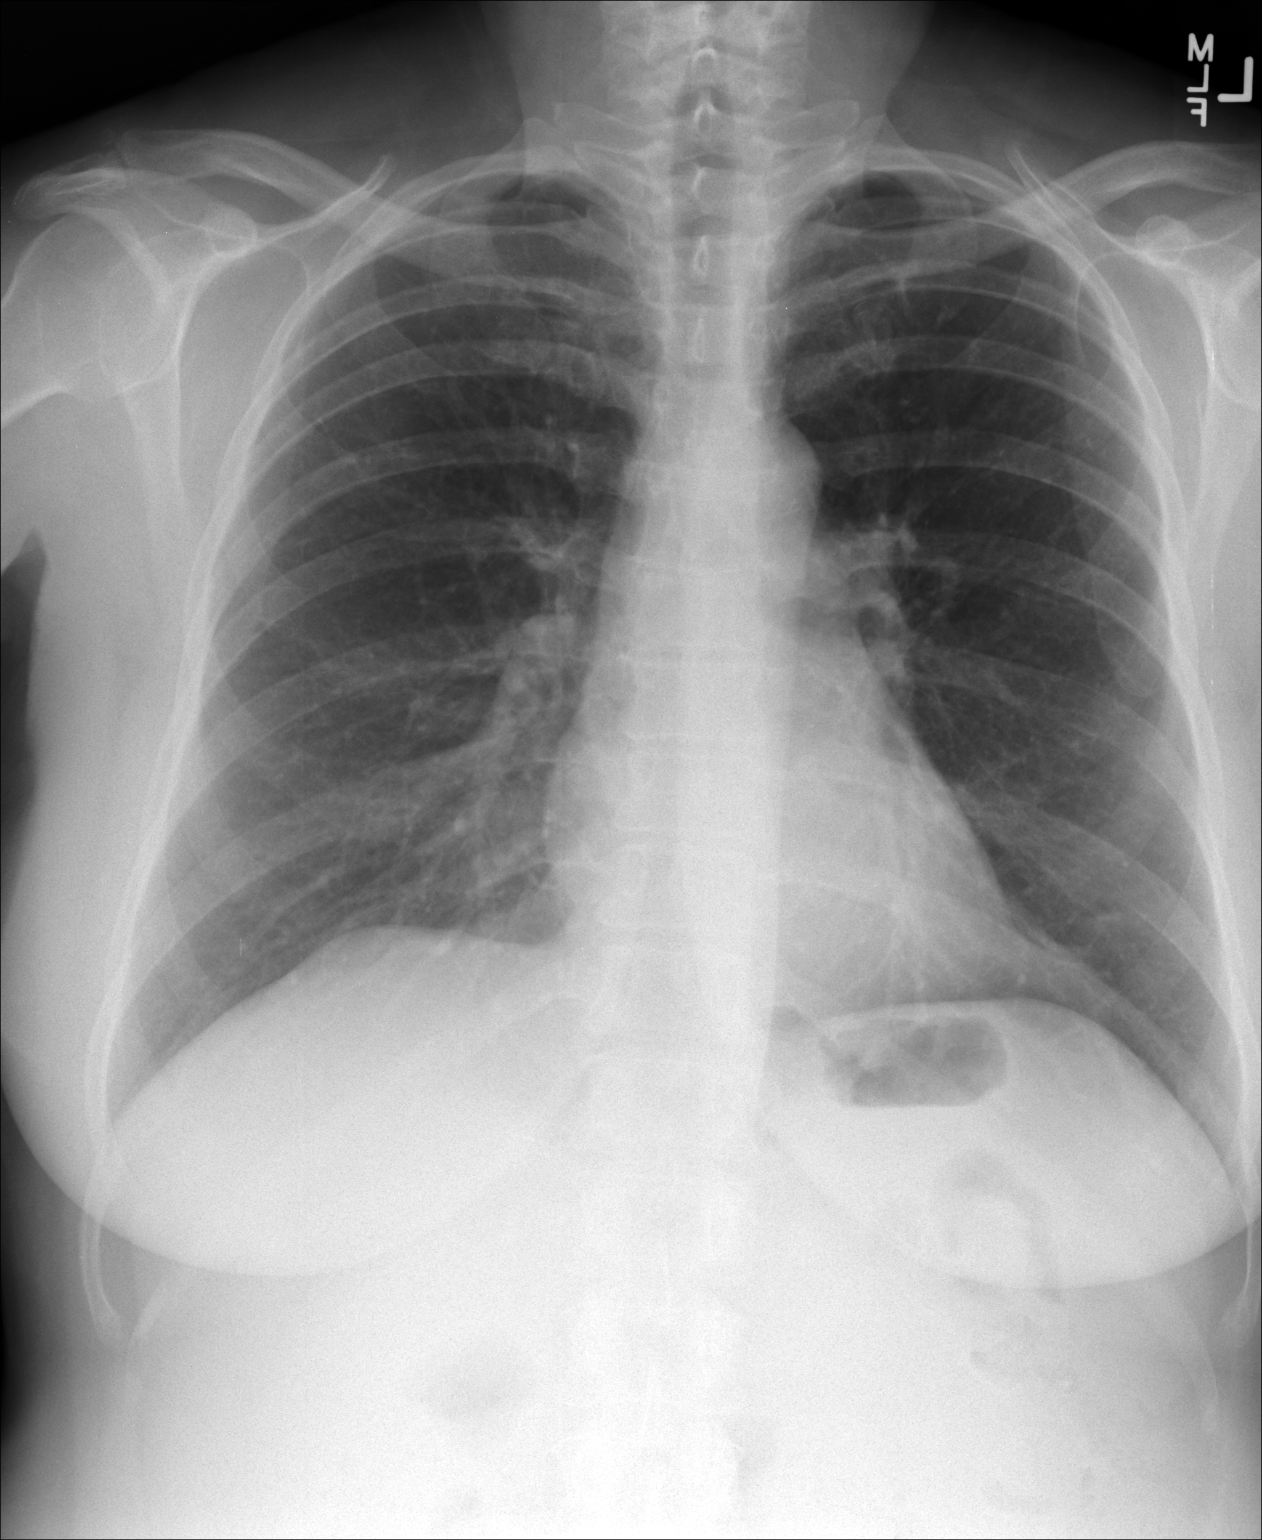

[lateral]
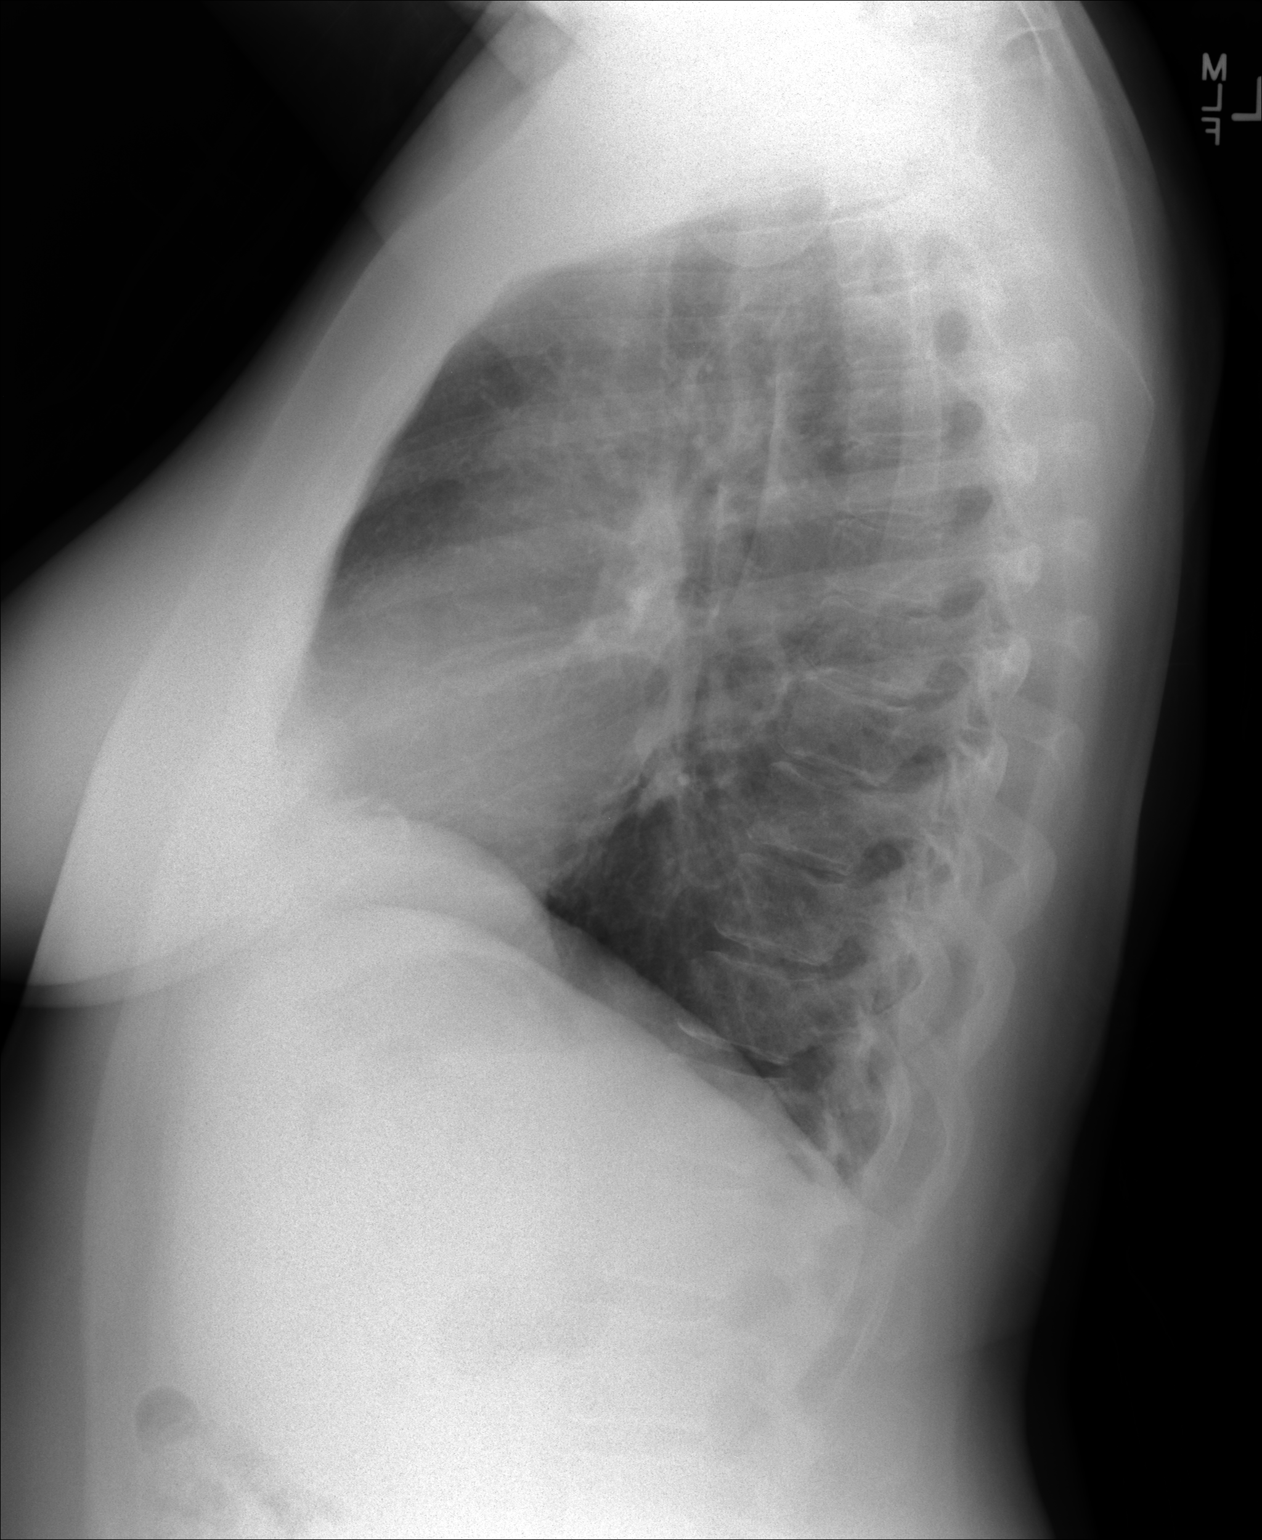

[2 of 2 positions shown; findings below may reference images not displayed]

FINDINGS: Normal heart size, mediastinal contours, and pulmonary vascularity.

Lungs clear.

No pneumothorax.

Bones unremarkable.
IMPRESSION: Normal exam.

## 2015-12-31 NOTE — Telephone Encounter (Signed)
Returned call to patient - labs all ok, no changes.  She was pleased

## 2016-01-01 NOTE — Addendum Note (Signed)
Addended by: Wyatt Haste on: 01/01/2016 07:58 AM   Modules accepted: Miquel Dunn

## 2016-01-02 ENCOUNTER — Telehealth: Payer: Self-pay | Admitting: Emergency Medicine

## 2016-01-02 ENCOUNTER — Encounter: Payer: Self-pay | Admitting: Emergency Medicine

## 2016-01-02 NOTE — Telephone Encounter (Signed)
Labs not released.Please release them

## 2016-01-26 ENCOUNTER — Other Ambulatory Visit: Payer: Self-pay | Admitting: Emergency Medicine

## 2016-06-28 ENCOUNTER — Ambulatory Visit (INDEPENDENT_AMBULATORY_CARE_PROVIDER_SITE_OTHER): Payer: BLUE CROSS/BLUE SHIELD | Admitting: Physician Assistant

## 2016-06-28 ENCOUNTER — Ambulatory Visit (INDEPENDENT_AMBULATORY_CARE_PROVIDER_SITE_OTHER): Payer: BLUE CROSS/BLUE SHIELD

## 2016-06-28 VITALS — BP 122/80 | HR 81 | Temp 98.1°F | Resp 18 | Ht 62.0 in | Wt 170.0 lb

## 2016-06-28 DIAGNOSIS — R059 Cough, unspecified: Secondary | ICD-10-CM

## 2016-06-28 DIAGNOSIS — R062 Wheezing: Secondary | ICD-10-CM | POA: Diagnosis not present

## 2016-06-28 DIAGNOSIS — R05 Cough: Secondary | ICD-10-CM | POA: Diagnosis not present

## 2016-06-28 LAB — POCT CBC
Granulocyte percent: 56.3 %G (ref 37–80)
HCT, POC: 40.4 % (ref 37.7–47.9)
Hemoglobin: 14.2 g/dL (ref 12.2–16.2)
Lymph, poc: 3.4 (ref 0.6–3.4)
MCH, POC: 30.9 pg (ref 27–31.2)
MCHC: 35.2 g/dL (ref 31.8–35.4)
MCV: 87.6 fL (ref 80–97)
MID (cbc): 0.5 (ref 0–0.9)
MPV: 7.9 fL (ref 0–99.8)
POC Granulocyte: 5 (ref 2–6.9)
POC LYMPH PERCENT: 38.3 %L (ref 10–50)
POC MID %: 5.4 %M (ref 0–12)
Platelet Count, POC: 258 10*3/uL (ref 142–424)
RBC: 4.61 M/uL (ref 4.04–5.48)
RDW, POC: 13.2 %
WBC: 8.8 10*3/uL (ref 4.6–10.2)

## 2016-06-28 MED ORDER — PREDNISONE 20 MG PO TABS: ORAL_TABLET | ORAL | 0 refills | Status: AC

## 2016-06-28 MED ORDER — ALBUTEROL SULFATE HFA 108 (90 BASE) MCG/ACT IN AERS: 2.0000 | INHALATION_SPRAY | Freq: Four times a day (QID) | RESPIRATORY_TRACT | 0 refills | Status: DC | PRN

## 2016-06-28 MED ORDER — IPRATROPIUM BROMIDE 0.02 % IN SOLN
0.5000 mg | Freq: Once | RESPIRATORY_TRACT | Status: AC
  Administered 2016-06-28: 0.5 mg via RESPIRATORY_TRACT

## 2016-06-28 MED ORDER — HYDROCODONE-HOMATROPINE 5-1.5 MG/5ML PO SYRP
2.5000 mL | ORAL_SOLUTION | Freq: Every day | ORAL | 0 refills | Status: AC
Start: 2016-06-28 — End: 2016-07-03

## 2016-06-28 MED ORDER — AZITHROMYCIN 250 MG PO TABS: ORAL_TABLET | ORAL | 0 refills | Status: AC

## 2016-06-28 MED ORDER — ALBUTEROL SULFATE (2.5 MG/3ML) 0.083% IN NEBU
2.5000 mg | INHALATION_SOLUTION | RESPIRATORY_TRACT | Status: AC
  Administered 2016-06-28: 2.5 mg via RESPIRATORY_TRACT

## 2016-06-28 NOTE — Patient Instructions (Signed)
     IF you received an x-ray today, you will receive an invoice from [facility]. Please contact  Radiology at [phone] with questions or concerns regarding your invoice.   IF you received labwork today, you will receive an invoice from [facility]. Please contact [facility] at [phone] with questions or concerns regarding your invoice.   Our billing staff will not be able to assist you with questions regarding bills from these companies.  You will be contacted with the lab results as soon as they are available. The fastest way to get your results is to activate your My Chart account. Instructions are located on the last page of this paperwork. If you have not heard from us regarding the results in 2 weeks, please contact this office.      

## 2016-06-28 NOTE — Progress Notes (Signed)
06/28/2016 6:04 PM   DOB: 24-Aug-1951 / MRN: [id number]  SUBJECTIVE:  Jennifer Marquez is a 65 y.o. female presenting for nasal congestion that started about 8 days ago.  Reports this seemed to get better at day 6 and then on day seven she seemed to worsen.  Associates cough with wheezing.  Denies a history of COPD and Asthma.  Has a 15 pack year history of smoking.   Immunization History  Administered Date(s) Administered  . Influenza Split 07/07/2013, 07/07/2014  . Influenza, High Dose Seasonal PF 07/14/2015  . Pneumococcal Conjugate-13 12/28/2015  . Pneumococcal Polysaccharide-23 08/15/2009  . Tdap 09/01/2012  . Zoster 11/07/2012     She has No Known Allergies.   She  has no past medical history on file.    She  reports that she has been smoking.  She has been smoking about 0.10 packs per day. She has never used smokeless tobacco. She reports that she does not drink alcohol or use drugs. She  reports that she does not engage in sexual activity. The patient  has a past surgical history that includes Tubal ligation.  Her family history includes Cancer in her brother and father; Heart disease in her brother; Hypertension in her mother; Stroke in her mother.  Review of Systems  Constitutional: Negative for fever.  Respiratory: Positive for cough, shortness of breath and wheezing. Negative for hemoptysis and sputum production.   Cardiovascular: Negative for chest pain, palpitations, orthopnea and leg swelling.  Skin: Negative for rash.  Neurological: Negative for dizziness.    The problem list and medications were reviewed and updated by myself where necessary and exist elsewhere in the encounter.   OBJECTIVE:  BP 122/80   Pulse 81   Temp 98.1 F (36.7 C) (Oral)   Resp 18   Ht 5\' 2"  (1.575 m)   Wt 170 lb (77.1 kg)   SpO2 96%   PF 250 L/min   BMI 31.09 kg/m   Physical Exam  HENT:  Right Ear: Tympanic membrane normal.  Left Ear: Tympanic membrane normal.  Nose: Nose  normal.  Mouth/Throat: Uvula is midline, oropharynx is clear and moist and mucous membranes are normal.  Cardiovascular: Normal rate and regular rhythm.   Pulmonary/Chest: Effort normal. No respiratory distress. She has wheezes. She has no rales. She exhibits no tenderness.    Results for orders placed or performed in visit on 06/28/16 (from the past 72 hour(s))  POCT CBC     Status: None   Collection Time: 06/28/16  4:58 PM  Result Value Ref Range   WBC 8.8 4.6 - 10.2 K/uL   Lymph, poc 3.4 0.6 - 3.4   POC LYMPH PERCENT 38.3 10 - 50 %L   MID (cbc) 0.5 0 - 0.9   POC MID % 5.4 0 - 12 %M   POC Granulocyte 5.0 2 - 6.9   Granulocyte percent 56.3 37 - 80 %G   RBC 4.61 4.04 - 5.48 M/uL   Hemoglobin 14.2 12.2 - 16.2 g/dL   HCT, POC 40.4 37.7 - 47.9 %   MCV 87.6 80 - 97 fL   MCH, POC 30.9 27 - 31.2 pg   MCHC 35.2 31.8 - 35.4 g/dL   RDW, POC 13.2 %   Platelet Count, POC 258 142 - 424 K/uL   MPV 7.9 0 - 99.8 fL   Lab Results  Component Value Date   HGBA1C 5.9 (H) 12/28/2015     Dg Chest 2 View  Result  Date: 06/28/2016 CLINICAL DATA:  Cough and wheezing, smoker EXAM: CHEST  2 VIEW COMPARISON:  10/11/2015 FINDINGS: Cardiomediastinal silhouette is unremarkable. No acute infiltrate or pleural effusion. No pulmonary edema. Mild perihilar bronchitic changes. Mild degenerative changes mid thoracic spine. IMPRESSION: No infiltrate or pulmonary edema. Mild perihilar bronchitic changes. Electronically Signed   By: Lahoma Crocker M.D.   On: 06/28/2016 17:24    ASSESSMENT AND PLAN  Charina was seen today for sinusitis.  Diagnoses and all orders for this visit:  Cough: Work up reassuring.  She is presenting in a COPD exacerbation type manner. Peak flow is 250 and she is wheezing.  She states she has quit smoking however she smells strongly of smoke.  I am going to go ahead and treat this like an asthma/COPD flare.   -     DG Chest 2 View; Future -     POCT CBC  Wheezing -     albuterol  (PROVENTIL) (2.5 MG/3ML) 0.083% nebulizer solution 2.5 mg; Take 3 mLs (2.5 mg total) by nebulization now. -     ipratropium (ATROVENT) nebulizer solution 0.5 mg; Take 2.5 mLs (0.5 mg total) by nebulization once. -     azithromycin (ZITHROMAX) 250 MG tablet; Take 2 tabs PO x 1 dose, then 1 tab PO QD x 4 days -     predniSONE (DELTASONE) 20 MG tablet; Take 3 in the morning for 3 days, then 2 in the morning for 3 days, and then 1 in the morning for 3 days. -     HYDROcodone-homatropine (HYCODAN) 5-1.5 MG/5ML syrup; Take 2.5-5 mLs by mouth at bedtime. -     albuterol (PROVENTIL HFA;VENTOLIN HFA) 108 (90 Base) MCG/ACT inhaler; Inhale 2 puffs into the lungs every 6 (six) hours as needed for wheezing or shortness of breath.    The patient is advised to call or return to clinic if she does not see an improvement in symptoms, or to seek the care of the closest emergency department if she worsens with the above plan.   Philis Fendt, MHS, PA-C [facility] 06/28/2016 6:04 PM

## 2016-08-13 ENCOUNTER — Encounter: Payer: Self-pay | Admitting: Physician Assistant

## 2016-11-08 ENCOUNTER — Other Ambulatory Visit: Payer: Self-pay | Admitting: Family Medicine

## 2016-11-08 DIAGNOSIS — Z1231 Encounter for screening mammogram for malignant neoplasm of breast: Secondary | ICD-10-CM

## 2016-11-18 ENCOUNTER — Ambulatory Visit
Admission: RE | Admit: 2016-11-18 | Discharge: 2016-11-18 | Disposition: A | Discharge status: Home / Self Care | Payer: BLUE CROSS/BLUE SHIELD | Source: Ambulatory Visit | Attending: Family Medicine | Admitting: Family Medicine

## 2016-11-18 DIAGNOSIS — Z1231 Encounter for screening mammogram for malignant neoplasm of breast: Secondary | ICD-10-CM

## 2016-11-20 ENCOUNTER — Encounter: Payer: BLUE CROSS/BLUE SHIELD | Admitting: Family Medicine

## 2017-01-01 ENCOUNTER — Encounter: Payer: Self-pay | Admitting: Family Medicine

## 2017-01-01 ENCOUNTER — Telehealth: Payer: Self-pay | Admitting: *Deleted

## 2017-01-01 ENCOUNTER — Ambulatory Visit (INDEPENDENT_AMBULATORY_CARE_PROVIDER_SITE_OTHER): Payer: BLUE CROSS/BLUE SHIELD | Admitting: Family Medicine

## 2017-01-01 VITALS — BP 125/77 | HR 75 | Temp 98.3°F | Resp 16 | Ht 62.0 in | Wt 172.0 lb

## 2017-01-01 DIAGNOSIS — F17200 Nicotine dependence, unspecified, uncomplicated: Secondary | ICD-10-CM | POA: Diagnosis not present

## 2017-01-01 DIAGNOSIS — Z800 Family history of malignant neoplasm of digestive organs: Secondary | ICD-10-CM

## 2017-01-01 DIAGNOSIS — E2839 Other primary ovarian failure: Secondary | ICD-10-CM

## 2017-01-01 DIAGNOSIS — Z1159 Encounter for screening for other viral diseases: Secondary | ICD-10-CM | POA: Diagnosis not present

## 2017-01-01 DIAGNOSIS — Z780 Asymptomatic menopausal state: Secondary | ICD-10-CM | POA: Diagnosis not present

## 2017-01-01 DIAGNOSIS — Z131 Encounter for screening for diabetes mellitus: Secondary | ICD-10-CM | POA: Diagnosis not present

## 2017-01-01 DIAGNOSIS — R0989 Other specified symptoms and signs involving the circulatory and respiratory systems: Secondary | ICD-10-CM | POA: Diagnosis not present

## 2017-01-01 DIAGNOSIS — E7800 Pure hypercholesterolemia, unspecified: Secondary | ICD-10-CM

## 2017-01-01 DIAGNOSIS — Z23 Encounter for immunization: Secondary | ICD-10-CM | POA: Diagnosis not present

## 2017-01-01 DIAGNOSIS — Z136 Encounter for screening for cardiovascular disorders: Secondary | ICD-10-CM

## 2017-01-01 DIAGNOSIS — Z0000 Encounter for general adult medical examination without abnormal findings: Secondary | ICD-10-CM

## 2017-01-01 LAB — POCT URINALYSIS DIP (MANUAL ENTRY)
Bilirubin, UA: NEGATIVE
Blood, UA: NEGATIVE
Glucose, UA: NEGATIVE
Ketones, POC UA: NEGATIVE
Leukocytes, UA: NEGATIVE
Nitrite, UA: NEGATIVE
Protein Ur, POC: NEGATIVE
Spec Grav, UA: 1.015 (ref 1.030–1.035)
Urobilinogen, UA: 0.2 (ref ?–2.0)
pH, UA: 5.5 (ref 5.0–8.0)

## 2017-01-01 MED ORDER — CONJ ESTROG-MEDROXYPROGEST ACE 0.3-1.5 MG PO TABS: 1.0000 | ORAL_TABLET | Freq: Every day | ORAL | 11 refills | Status: DC

## 2017-01-01 MED ORDER — ATORVASTATIN CALCIUM 40 MG PO TABS: 40.0000 mg | ORAL_TABLET | Freq: Every day | ORAL | 11 refills | Status: DC

## 2017-01-01 MED ORDER — VARENICLINE TARTRATE 0.5 MG X 11 & 1 MG X 42 PO MISC: ORAL | 0 refills | Status: DC

## 2017-01-01 MED ORDER — VARENICLINE TARTRATE 1 MG PO TABS: 1.0000 mg | ORAL_TABLET | Freq: Two times a day (BID) | ORAL | 2 refills | Status: DC

## 2017-01-01 MED ORDER — ZOSTER VAC RECOMB ADJUVANTED 50 MCG/0.5ML IM SUSR: 0.5000 mL | Freq: Once | INTRAMUSCULAR | 1 refills | Status: AC

## 2017-01-01 NOTE — Progress Notes (Signed)
Subjective:    Patient ID: Jennifer Marquez, female    DOB: Jan 02, 1951, 66 y.o.   MRN: [id number]  01/01/2017  Annual Exam and Medication Refill (chantix)   HPI This 66 y.o. female presents for Annual Wellness Examination with Complete Physical Examination and follow-up of chronic medical conditions.  Last physical: 12-28-2015 Nena Jordan, MD Pap smear:11-03-2014 WNL Menopause in fifties.  Mammogram: 11-18-2016 Colonoscopy:  11-2008; Brother with colon cancer with liver mets; [place] Bone density: never Eye exam:  +glasses. Every year. No g/c/md. Dental exam:  Every six months.  Immunization History  Administered Date(s) Administered  . Influenza Split 07/07/2013, 07/07/2014  . Influenza, High Dose Seasonal PF 07/14/2015, 07/24/2016  . Pneumococcal Conjugate-13 12/28/2015  . Pneumococcal Polysaccharide-23 08/15/2009, 01/01/2017  . Tdap 09/01/2012  . Zoster 11/07/2012  . Zoster Recombinat (Shingrix) 01/07/2017   BP Readings from Last 3 Encounters:  01/01/17 125/77  06/28/16 122/80  12/28/15 118/64   Wt Readings from Last 3 Encounters:  01/01/17 172 lb (78 kg)  06/28/16 170 lb (77.1 kg)  12/28/15 164 lb (74.4 kg)     Review of Systems  Constitutional: Negative for activity change, appetite change, chills, diaphoresis, fatigue, fever and unexpected weight change.  HENT: Negative for congestion, dental problem, drooling, ear discharge, ear pain, facial swelling, hearing loss, mouth sores, nosebleeds, postnasal drip, rhinorrhea, sinus pressure, sneezing, sore throat, tinnitus, trouble swallowing and voice change.   Eyes: Negative for photophobia, pain, discharge, redness, itching and visual disturbance.  Respiratory: Negative for apnea, cough, choking, chest tightness, shortness of breath, wheezing and stridor.   Cardiovascular: Negative for chest pain, palpitations and leg swelling.  Gastrointestinal: Negative for abdominal distention, abdominal pain, anal bleeding, blood in  stool, constipation, diarrhea, nausea, rectal pain and vomiting.  Endocrine: Negative for cold intolerance, heat intolerance, polydipsia, polyphagia and polyuria.  Genitourinary: Negative for decreased urine volume, difficulty urinating, dyspareunia, dysuria, enuresis, flank pain, frequency, genital sores, hematuria, menstrual problem, pelvic pain, urgency, vaginal bleeding, vaginal discharge and vaginal pain.  Musculoskeletal: Negative for arthralgias, back pain, gait problem, joint swelling, myalgias, neck pain and neck stiffness.  Skin: Negative for color change, pallor, rash and wound.  Allergic/Immunologic: Negative for environmental allergies, food allergies and immunocompromised state.  Neurological: Negative for dizziness, tremors, seizures, syncope, facial asymmetry, speech difficulty, weakness, light-headedness, numbness and headaches.  Hematological: Negative for adenopathy. Does not bruise/bleed easily.  Psychiatric/Behavioral: Negative for agitation, behavioral problems, confusion, decreased concentration, dysphoric mood, hallucinations, self-injury, sleep disturbance and suicidal ideas. The patient is not nervous/anxious and is not hyperactive.     Past Medical History:  Diagnosis Date  . Hyperlipidemia    Past Surgical History:  Procedure Laterality Date  . TUBAL LIGATION     No Known Allergies  Social History   Social History  . Marital status: Married    Spouse name: N/A  . Number of children: N/A  . Years of education: N/A   Occupational History  . accountant    Social History Main Topics  . Smoking status: Current Every Day Smoker    Packs/day: 0.10  . Smokeless tobacco: Never Used     Comment: would like to discuss  . Alcohol use No  . Drug use: No  . Sexual activity: No   Other Topics Concern  . Not on file   Social History Narrative   Marital status: married x 29 years     Children: 2 sons; 8 grandchildren; 2 gg.      Lives: with mom,  husband       Employment: Engineer, maintenance work. Loves work; Public librarian.      Education:  High School      Tobacco: 8 cigarettes per day x 20 years      Alcohol: none      Drugs: none      Exercise: none in 2018     Seatbelt:  100%; no texting.      Family History  Problem Relation Age of Onset  . Hypertension Mother   . Stroke Mother 4    TIA/CVA  . Cancer Father 34    esophageal cancer  . Cancer Brother 43    colon cancer  . Heart disease Brother   . COPD Brother        Objective:    BP 125/77   Pulse 75   Temp 98.3 F (36.8 C) (Oral)   Resp 16   Ht 5\' 2"  (1.575 m)   Wt 172 lb (78 kg)   SpO2 98%   BMI 31.46 kg/m  Physical Exam  Constitutional: She is oriented to person, place, and time. She appears well-developed and well-nourished. No distress.  HENT:  Head: Normocephalic and atraumatic.  Right Ear: External ear normal.  Left Ear: External ear normal.  Nose: Nose normal.  Mouth/Throat: Oropharynx is clear and moist.  Eyes: Conjunctivae and EOM are normal. Pupils are equal, round, and reactive to light.  Neck: Normal range of motion and full passive range of motion without pain. Neck supple. No JVD present. Carotid bruit is present. No thyromegaly present.  B carotid bruits  Cardiovascular: Normal rate, regular rhythm and normal heart sounds.  Exam reveals no gallop and no friction rub.   No murmur heard. Pulmonary/Chest: Effort normal and breath sounds normal. She has no wheezes. She has no rales. Right breast exhibits no inverted nipple, no mass, no nipple discharge, no skin change and no tenderness. Left breast exhibits no inverted nipple, no mass, no nipple discharge, no skin change and no tenderness. Breasts are symmetrical.  Abdominal: Soft. Bowel sounds are normal. She exhibits no distension and no mass. There is no tenderness. There is no rebound and no guarding.  Musculoskeletal:       Right shoulder: Normal.       Left shoulder: Normal.       Cervical  back: Normal.  Lymphadenopathy:    She has no cervical adenopathy.  Neurological: She is alert and oriented to person, place, and time. She has normal reflexes. No cranial nerve deficit. She exhibits normal muscle tone. Coordination normal.  Skin: Skin is warm and dry. No rash noted. She is not diaphoretic. No erythema. No pallor.  Psychiatric: She has a normal mood and affect. Her behavior is normal. Judgment and thought content normal.  Nursing note and vitals reviewed.  Depression screen [facility] 2/9 01/01/2017 06/28/2016 12/28/2015 11/03/2014  Decreased Interest 0 0 0 0  Down, Depressed, Hopeless 0 0 0 0  PHQ - 2 Score 0 0 0 0   Fall Risk  01/01/2017 06/28/2016 12/28/2015 11/03/2014  Falls in the past year? No No No No   Functional Status Survey: Is the patient deaf or have difficulty hearing?: No Does the patient have difficulty seeing, even when wearing glasses/contacts?: No Does the patient have difficulty concentrating, remembering, or making decisions?: No Does the patient have difficulty walking or climbing stairs?: No Does the patient have difficulty dressing or bathing?: No Does the patient have difficulty doing errands alone such as  visiting a doctor's office or shopping?: No      Assessment & Plan:   1. Routine physical examination   2. Pure hypercholesterolemia   3. Menopause   4. Smoker   5. Need for hepatitis C screening test   6. Screening for diabetes mellitus   7. Family history of colon cancer   8. Screening for cardiovascular condition   9. Need for prophylactic vaccination against Streptococcus pneumoniae (pneumococcus)   10. Need for shingles vaccine   11. Bilateral carotid bruits   12. Estrogen deficiency    -anticipatory guidance --- weight loss, exercise, ASA 81mg  daily, and calcium three servings daily. -s/p Pneumovax 23. -refer for bone density scan. -precontemplative regarding smoking cessation; counseled for 10 minutes regarding smoking cessation.  Rx for  Chantix provided. -obtain age appropriate screening labs and labs for chronic disease management. -refills provided. -refill of HRT provided and counseled on the need to wean medication over the upcoming 1-2 years.  Pt expressed understanding. -colonoscopy UTD -rx for Shingrx provided. -strong family history of cardiovascular disease thus highly recommend smoking cessation, weaning HRT, and control of lipids.   Orders Placed This Encounter  Procedures  . US Carotid Duplex Bilateral    EPIC ORDER/ [id number]/NO NEEDS/INS-BCBS/CLC/PT    Standing Status:   Future    Number of Occurrences:   1    Standing Expiration Date:   03/03/2018    Order Specific Question:   Reason for exam:    Answer:   carotid bruit R>L; tobacco abuse; hypercholesterolemia    Order Specific Question:   Preferred imaging location?    Answer:   [facility]  . DG Bone Density    Ins: bcbs// pf: first per the pt// no needs// advised the pt on cal supplements per pt kdc     Standing Status:   Future    Number of Occurrences:   1    Standing Expiration Date:   03/03/2018    Order Specific Question:   Reason for Exam (SYMPTOM  OR DIAGNOSIS REQUIRED)    Answer:   estrogen deficiency, smoker    Order Specific Question:   Preferred imaging location?    Answer:   [facility]  . Pneumococcal polysaccharide vaccine 23-valent greater than or equal to 2yo subcutaneous/IM  . CBC with Differential/Platelet  . Comprehensive metabolic panel    Order Specific Question:   Has the patient fasted?    Answer:   Yes  . Lipid panel    Order Specific Question:   Has the patient fasted?    Answer:   Yes  . Hepatitis C antibody  . POCT urinalysis dipstick  . EKG 12-Lead   Meds ordered this encounter  Medications  . Zoster Vac Recomb Adjuvanted [facility]) injection    Sig: Inject 0.5 mLs into the muscle once.    Dispense:  0.5 mL    Refill:  1  . varenicline (CHANTIX STARTING MONTH PAK) 0.5 MG X 11 & 1 MG X 42  tablet    Sig: Take one 0.5 mg tablet by mouth once daily for 3 days, then increase to one 0.5 mg tablet twice daily for 4 days, then increase to one 1 mg tablet twice daily.    Dispense:  53 tablet    Refill:  0  . varenicline (CHANTIX CONTINUING MONTH PAK) 1 MG tablet    Sig: Take 1 tablet (1 mg total) by mouth 2 (two) times daily.    Dispense:  60 tablet  Refill:  2  . atorvastatin (LIPITOR) 40 MG tablet    Sig: Take 1 tablet (40 mg total) by mouth daily.    Dispense:  30 tablet    Refill:  11  . DISCONTD: estrogen, conjugated,-medroxyprogesterone (PREMPRO) 0.3-1.5 MG tablet    Sig: Take 1 tablet by mouth daily.    Dispense:  30 tablet    Refill:  11    Return in about 1 year (around 01/01/2018) for complete physical examiniation.   Marquice Uddin Elayne Guerin, M.D. Primary Care at [facility] [address] [place], [place]  [address] [phone] phone [phone] fax

## 2017-01-01 NOTE — Telephone Encounter (Signed)
Called patient left message in cell voicemail she left Rx and AVS at [facility]. Return to get them.

## 2017-01-01 NOTE — Patient Instructions (Addendum)
I highly recommend undergoing repeat colonoscopy this year due to your family history of colon cancer.  I am happy to place a referral when you are ready. Please start a baby aspirin 11m one tablet daily to decrease your risk of heart attack, stroke, and colon cancer.      IF you received an x-ray today, you will [facility]. Please contact [facility] at [phone] questions or concerns regarding your invoice.   IF you received labwork today, you will receive an invoice from [facility] Please contact [facility] at [phone] questions or concerns regarding your invoice.   Our billing staff will not be able to assist you with questions regarding bills from these companies.  You will be contacted with the lab results as soon as they are available. The fastest way to get your results is to activate your My Chart account. Instructions are located on the last page of this paperwork. If you have not heard from [place] results in 2 weeks, please contact this office.     prev Preventive Care 639Years and Older, Female Preventive care refers to lifestyle choices and visits with your health care provider that can promote health and wellness. What does preventive care include?  A yearly physical exam. This is also called an annual well check.  Dental exams once or twice a year.  Routine eye exams. Ask your health care provider how often you should have your eyes checked.  Personal lifestyle choices, including:  Daily care of your teeth and gums.  Regular physical activity.  Eating a healthy diet.  Avoiding tobacco and drug use.  Limiting alcohol use.  Practicing safe sex.  Taking low-dose aspirin every day.  Taking vitamin and mineral supplements as recommended by your health care provider. What happens during an annual well check? The services and screenings done by your health care provider during your annual well  check will depend on your age, overall health, lifestyle risk factors, and family history of disease. Counseling  Your health care provider may ask you questions about your:  Alcohol use.  Tobacco use.  Drug use.  Emotional well-being.  Home and relationship well-being.  Sexual activity.  Eating habits.  History of falls.  Memory and ability to understand (cognition).  Work and work eStatistician  Reproductive health. Screening  You may have the following tests or measurements:  Height, weight, and BMI.  Blood pressure.  Lipid and cholesterol levels. These may be checked every 5 years, or more frequently if you are over 571years old.  Skin check.  Lung cancer screening. You may have this screening every year starting at age 67if you have a 30-pack-year history of smoking and currently smoke or have quit within the past 15 years.  Fecal occult blood test (FOBT) of the stool. You may have this test every year starting at age 67  Flexible sigmoidoscopy or colonoscopy. You may have a sigmoidoscopy every 5 years or a colonoscopy every 10 years starting at age 67  Hepatitis C blood test.  Hepatitis B blood test.  Sexually transmitted disease (STD) testing.  Diabetes screening. This is done by checking your blood sugar (glucose) after you have not eaten for a while (fasting). You may have this done every 1-3 years.  Bone density scan. This is done to screen for osteoporosis. You may have this done starting at age 67  Mammogram. This may be done every 1-2 years. Talk to your health  care provider about how often you should have regular mammograms. Talk with your health care provider about your test results, treatment options, and if necessary, the need for more tests. Vaccines  Your health care provider may recommend certain vaccines, such as:  Influenza vaccine. This is recommended every year.  Tetanus, diphtheria, and acellular pertussis (Tdap, Td) vaccine. You  may need a Td booster every 10 years.  Varicella vaccine. You may need this if you have not been vaccinated.  Zoster vaccine. You may need this after age 67.  Measles, mumps, and rubella (MMR) vaccine. You may need at least one dose of MMR if you were born in 1957 or later. You may also need a second dose.  Pneumococcal 13-valent conjugate (PCV13) vaccine. One dose is recommended after age 82.  Pneumococcal polysaccharide (PPSV23) vaccine. One dose is recommended after age 67.  Meningococcal vaccine. You may need this if you have certain conditions.  Hepatitis A vaccine. You may need this if you have certain conditions or if you travel or work in places where you may be exposed to hepatitis A.  Hepatitis B vaccine. You may need this if you have certain conditions or if you travel or work in places where you may be exposed to hepatitis B.  Haemophilus influenzae type b (Hib) vaccine. You may need this if you have certain conditions. Talk to your health care provider about which screenings and vaccines you need and how often you need them. This information is not intended to replace advice given to you by your health care provider. Make sure you discuss any questions you have with your health care provider. Document Released: 10/20/2015 Document Revised: 06/12/2016 Document Reviewed: 07/25/2015 Elsevier Interactive Patient Education  2017 [employer].

## 2017-01-02 LAB — CBC WITH DIFFERENTIAL/PLATELET
Basophils Absolute: 0 10*3/uL (ref 0.0–0.2)
Basos: 0 %
EOS (ABSOLUTE): 0.2 10*3/uL (ref 0.0–0.4)
Eos: 1 %
Hematocrit: 42.3 % (ref 34.0–46.6)
Hemoglobin: 13.8 g/dL (ref 11.1–15.9)
Immature Grans (Abs): 0 10*3/uL (ref 0.0–0.1)
Immature Granulocytes: 0 %
Lymphocytes Absolute: 5 10*3/uL — ABNORMAL HIGH (ref 0.7–3.1)
Lymphs: 36 %
MCH: 30.2 pg (ref 26.6–33.0)
MCHC: 32.6 g/dL (ref 31.5–35.7)
MCV: 93 fL (ref 79–97)
Monocytes Absolute: 0.8 10*3/uL (ref 0.1–0.9)
Monocytes: 6 %
Neutrophils Absolute: 7.8 10*3/uL — ABNORMAL HIGH (ref 1.4–7.0)
Neutrophils: 57 %
Platelets: 246 10*3/uL (ref 150–379)
RBC: 4.57 x10E6/uL (ref 3.77–5.28)
RDW: 14.2 % (ref 12.3–15.4)
WBC: 13.9 10*3/uL — ABNORMAL HIGH (ref 3.4–10.8)

## 2017-01-02 LAB — COMPREHENSIVE METABOLIC PANEL
ALT: 14 IU/L (ref 0–32)
AST: 10 IU/L (ref 0–40)
Albumin/Globulin Ratio: 2 (ref 1.2–2.2)
Albumin: 4.3 g/dL (ref 3.6–4.8)
Alkaline Phosphatase: 66 IU/L (ref 39–117)
BUN/Creatinine Ratio: 15 (ref 12–28)
BUN: 15 mg/dL (ref 8–27)
Bilirubin Total: 0.6 mg/dL (ref 0.0–1.2)
CO2: 25 mmol/L (ref 18–29)
Calcium: 9.5 mg/dL (ref 8.7–10.3)
Chloride: 104 mmol/L (ref 96–106)
Creatinine, Ser: 0.97 mg/dL (ref 0.57–1.00)
GFR calc Af Amer: 70 mL/min/{1.73_m2} (ref >59)
GFR calc non Af Amer: 61 mL/min/{1.73_m2} (ref >59)
Globulin, Total: 2.2 g/dL (ref 1.5–4.5)
Glucose: 78 mg/dL (ref 65–99)
Potassium: 4.3 mmol/L (ref 3.5–5.2)
Sodium: 147 mmol/L — ABNORMAL HIGH (ref 134–144)
Total Protein: 6.5 g/dL (ref 6.0–8.5)

## 2017-01-02 LAB — HEPATITIS C ANTIBODY: Hep C Virus Ab: <0.1 {s_co_ratio} (ref 0.0–0.9)

## 2017-01-02 LAB — LIPID PANEL
Chol/HDL Ratio: 2.9 ratio (ref 0.0–4.4)
Cholesterol, Total: 156 mg/dL (ref 100–199)
HDL: 53 mg/dL
LDL Calculated: 66 mg/dL (ref 0–99)
Triglycerides: 183 mg/dL — ABNORMAL HIGH (ref 0–149)
VLDL Cholesterol Cal: 37 mg/dL (ref 5–40)

## 2017-01-04 MED ORDER — CONJ ESTROG-MEDROXYPROGEST ACE 0.3-1.5 MG PO TABS: 1.0000 | ORAL_TABLET | Freq: Every day | ORAL | 4 refills | Status: DC

## 2017-01-07 ENCOUNTER — Telehealth: Payer: Self-pay | Admitting: Family Medicine

## 2017-01-07 NOTE — Telephone Encounter (Signed)
Pt was here in March for a CPE with Dr Tamala Julian she has been waiting for a call about an U/S carotid duplex Bilateral and Bone Density referral please respond

## 2017-01-13 ENCOUNTER — Ambulatory Visit
Admission: RE | Admit: 2017-01-13 | Discharge: 2017-01-13 | Disposition: A | Discharge status: Home / Self Care | Payer: BLUE CROSS/BLUE SHIELD | Source: Ambulatory Visit | Attending: Family Medicine | Admitting: Family Medicine

## 2017-01-13 DIAGNOSIS — E2839 Other primary ovarian failure: Secondary | ICD-10-CM

## 2017-01-13 DIAGNOSIS — F17200 Nicotine dependence, unspecified, uncomplicated: Secondary | ICD-10-CM

## 2017-01-14 ENCOUNTER — Ambulatory Visit
Admission: RE | Admit: 2017-01-14 | Discharge: 2017-01-14 | Disposition: A | Discharge status: Home / Self Care | Payer: BLUE CROSS/BLUE SHIELD | Source: Ambulatory Visit | Attending: Family Medicine | Admitting: Family Medicine

## 2017-01-14 DIAGNOSIS — R0989 Other specified symptoms and signs involving the circulatory and respiratory systems: Secondary | ICD-10-CM

## 2017-01-14 NOTE — Telephone Encounter (Signed)
Pt scheduled with [facility] for 4/10 at 2:00 pm

## 2017-01-28 ENCOUNTER — Encounter: Payer: Self-pay | Admitting: Family Medicine

## 2017-01-30 ENCOUNTER — Other Ambulatory Visit: Payer: Self-pay

## 2017-01-30 MED ORDER — CONJ ESTROG-MEDROXYPROGEST ACE 0.3-1.5 MG PO TABS: 1.0000 | ORAL_TABLET | Freq: Every day | ORAL | 3 refills | Status: DC

## 2017-02-08 ENCOUNTER — Encounter: Payer: Self-pay | Admitting: Family Medicine

## 2017-02-08 DIAGNOSIS — Z1211 Encounter for screening for malignant neoplasm of colon: Secondary | ICD-10-CM

## 2017-03-03 ENCOUNTER — Encounter: Payer: Self-pay | Admitting: Family Medicine

## 2017-05-08 ENCOUNTER — Other Ambulatory Visit: Payer: Self-pay | Admitting: Family Medicine

## 2017-05-11 MED ORDER — VARENICLINE TARTRATE 0.5 MG X 11 & 1 MG X 42 PO MISC: ORAL | 0 refills | Status: DC

## 2017-05-11 MED ORDER — VARENICLINE TARTRATE 1 MG PO TABS: 1.0000 mg | ORAL_TABLET | Freq: Two times a day (BID) | ORAL | 2 refills | Status: DC

## 2017-05-26 MED ORDER — VARENICLINE TARTRATE 0.5 MG X 11 & 1 MG X 42 PO MISC: ORAL | 0 refills | Status: DC

## 2017-05-26 MED ORDER — VARENICLINE TARTRATE 1 MG PO TABS: 1.0000 mg | ORAL_TABLET | Freq: Two times a day (BID) | ORAL | 2 refills | Status: DC

## 2017-05-26 NOTE — Addendum Note (Signed)
Addended by: Wardell Honour on: 05/26/2017 12:49 PM   Modules accepted: Orders

## 2017-05-26 NOTE — Telephone Encounter (Signed)
Please call in refill of Chantix starter pack as approved.

## 2017-07-02 ENCOUNTER — Ambulatory Visit (INDEPENDENT_AMBULATORY_CARE_PROVIDER_SITE_OTHER): Payer: Commercial Managed Care - PPO | Admitting: Family Medicine

## 2017-07-02 ENCOUNTER — Encounter: Payer: Self-pay | Admitting: Family Medicine

## 2017-07-02 VITALS — BP 126/70 | HR 81 | Temp 98.2°F | Resp 18 | Ht 62.0 in | Wt 175.6 lb

## 2017-07-02 DIAGNOSIS — F17200 Nicotine dependence, unspecified, uncomplicated: Secondary | ICD-10-CM | POA: Diagnosis not present

## 2017-07-02 DIAGNOSIS — R0981 Nasal congestion: Secondary | ICD-10-CM | POA: Diagnosis not present

## 2017-07-02 DIAGNOSIS — J00 Acute nasopharyngitis [common cold]: Secondary | ICD-10-CM

## 2017-07-02 MED ORDER — FLUTICASONE PROPIONATE 50 MCG/ACT NA SUSP: 2.0000 | Freq: Every day | NASAL | 6 refills | Status: DC

## 2017-07-02 MED ORDER — AMOXICILLIN-POT CLAVULANATE 875-125 MG PO TABS: 1.0000 | ORAL_TABLET | Freq: Two times a day (BID) | ORAL | 0 refills | Status: DC

## 2017-07-02 NOTE — Patient Instructions (Addendum)
     IF you received an x-ray today, you will receive an invoice from [facility]. Please contact [facility] at [phone] with questions or concerns regarding your invoice.   IF you received labwork today, you will receive an invoice from [facility]. Please contact [facility] at [phone] with questions or concerns regarding your invoice.   Our billing staff will not be able to assist you with questions regarding bills from these companies.  You will be contacted with the lab results as soon as they are available. The fastest way to get your results is to activate your My Chart account. Instructions are located on the last page of this paperwork. If you have not heard from [place] regarding the results in 2 weeks, please contact this office.    Sinus Rinse What is a sinus rinse? A sinus rinse is a simple home treatment that is used to rinse your sinuses with a sterile mixture of salt and water (saline solution). Sinuses are air-filled spaces in your skull behind the bones of your face and forehead that open into your nasal cavity. You will use the following:  Saline solution.  Neti pot or spray bottle. This releases the saline solution into your nose and through your sinuses. Neti pots and spray bottles can be purchased at [employer], a health food store, or online.  When would I do a sinus rinse? A sinus rinse can help to clear mucus, dirt, dust, or pollen from the nasal cavity. You may do a sinus rinse when you have a cold, a virus, nasal allergy symptoms, a sinus infection, or stuffiness in the nose or sinuses. If you are considering a sinus rinse:  Ask your child's health care provider before performing a sinus rinse on your child.  Do not do a sinus rinse if you have had ear or nasal surgery, ear infection, or blocked ears.  How do I do a sinus rinse?  Wash your hands.  Disinfect your device according to the directions provided and then dry it.  Use  the solution that comes with your device or one that is sold separately in stores. Follow the mixing directions on the package.  Fill your device with the amount of saline solution as directed by the device instructions.  Stand over a sink and tilt your head sideways over the sink.  Place the spout of the device in your upper nostril (the one closer to the ceiling).  Gently pour or squeeze the saline solution into the nasal cavity. The liquid should drain to the lower nostril if you are not overly congested.  Gently blow your nose. Blowing too hard may cause ear pain.  Repeat in the other nostril.  Clean and rinse your device with clean water and then air-dry it. Are there risks of a sinus rinse? Sinus rinse is generally very safe and effective. However, there are a few risks, which include:  A burning sensation in the sinuses. This may happen if you do not make the saline solution as directed. Make sure to follow all directions when making the saline solution.  Infection from contaminated water. This is rare, but possible.  Nasal irritation.  This information is not intended to replace advice given to you by your health care provider. Make sure you discuss any questions you have with your health care provider. Document Released: 04/20/2014 Document Revised: 08/20/2016 Document Reviewed: 02/08/2014 Elsevier Interactive Patient Education  2017 [employer].

## 2017-07-02 NOTE — Progress Notes (Signed)
Chief Complaint  Patient presents with  . Cough    x 3 week, headaches when lying down, last 2 nights cough until she has to get up and sleep in recliner. Otc sudafed and nyquil with some relief, has not rested for the past couple nights due to cough, no drainage.  . head congestion    HPI   Upper Respiratory Infection: Patient complains of symptoms of a URI. Symptoms include congestion and cough. Onset of symptoms was 3 weeks ago, gradually worsening since that time. She also c/o congestion, cough described as productive of yellow sputum, facial pain, nasal congestion, post nasal drip, productive cough with  yellow colored sputum, purulent nasal discharge and sinus pressure for the past 3 weeks .  She is drinking plenty of fluids. Evaluation to date: none. Treatment to date: antihistamines, cough suppressants and decongestants.  Smoking She has gone 2 days without smoking on chantix Denies wheezing States that she is doing well and that cigarettes taste terrible  Past Medical History:  Diagnosis Date  . Hyperlipidemia     Current Outpatient Prescriptions  Medication Sig Dispense Refill  . atorvastatin (LIPITOR) 40 MG tablet Take 1 tablet (40 mg total) by mouth daily. 30 tablet 11  . estrogen, conjugated,-medroxyprogesterone (PREMPRO) 0.3-1.5 MG tablet Take 1 tablet by mouth daily. 90 tablet 3  . varenicline (CHANTIX CONTINUING MONTH PAK) 1 MG tablet Take 1 tablet (1 mg total) by mouth 2 (two) times daily. 60 tablet 2  . albuterol (PROVENTIL HFA;VENTOLIN HFA) 108 (90 Base) MCG/ACT inhaler Inhale 2 puffs into the lungs every 6 (six) hours as needed for wheezing or shortness of breath. (Patient not taking: Reported on 01/01/2017) 1 Inhaler 0  . amoxicillin-clavulanate (AUGMENTIN) 875-125 MG tablet Take 1 tablet by mouth 2 (two) times daily. 20 tablet 0  . fluticasone (FLONASE) 50 MCG/ACT nasal spray Place 2 sprays into both nostrils daily. 16 g 6  . varenicline (CHANTIX STARTING MONTH  PAK) 0.5 MG X 11 & 1 MG X 42 tablet Take one 0.5 mg tablet by mouth once daily for 3 days, then increase to one 0.5 mg tablet twice daily for 4 days, then increase to one 1 mg tablet twice daily. 53 tablet 0   No current facility-administered medications for this visit.     Allergies: No Known Allergies  Past Surgical History:  Procedure Laterality Date  . TUBAL LIGATION      Social History   Social History  . Marital status: Married    Spouse name: N/A  . Number of children: N/A  . Years of education: N/A   Occupational History  . accountant    Social History Main Topics  . Smoking status: Current Every Day Smoker    Packs/day: 0.10  . Smokeless tobacco: Never Used     Comment: would like to discuss  . Alcohol use No  . Drug use: No  . Sexual activity: No   Other Topics Concern  . None   Social History Narrative   Marital status: married x 29 years     Children: 2 sons; 8 grandchildren; 2 gg.      Lives: with mom, husband      Employment: Engineer, maintenance work. Loves work; Public librarian.      Education:  High School      Tobacco: 8 cigarettes per day x 20 years      Alcohol: none      Drugs: none      Exercise: none in  2018     Seatbelt:  100%; no texting.       ROS Review of Systems See HPI Constitution: No fevers or chills No malaise No diaphoresis Skin: No rash or itching Eyes: no blurry vision, no double vision GU: no dysuria or hematuria Neuro: no dizziness or headaches  Objective: Vitals:   07/02/17 1521  BP: 126/70  Pulse: 81  Resp: 18  Temp: 98.2 F (36.8 C)  TempSrc: Oral  SpO2: 93%  Weight: 175 lb 9.6 oz (79.7 kg)  Height: 5\' 2"  (1.575 m)    Physical Exam  General: alert, oriented, in NAD Head: normocephalic, atraumatic, + frontal and maxillary sinus tenderness Eyes: EOM intact, no scleral icterus or conjunctival injection Ears: TM clear bilaterally Nose: mucosa nonerythematous, nonedematous Throat: no pharyngeal  exudate or erythema Lymph: no posterior auricular, submental or cervical lymph adenopathy Heart: normal rate, normal sinus rhythm, no murmurs Lungs: clear to auscultation bilaterally, no wheezing   Assessment and Plan Jennifer Marquez was seen today for cough and head congestion.  Diagnoses and all orders for this visit:  Sinus congestion -  Advised pt to try flonase and mucinex If symptoms progress she was given augmentin  Acute rhinitis flonase  Smoker Continue chantix  Other orders Pt will return for flu  -     amoxicillin-clavulanate (AUGMENTIN) 875-125 MG tablet; Take 1 tablet by mouth 2 (two) times daily. -     fluticasone (FLONASE) 50 MCG/ACT nasal spray; Place 2 sprays into both nostrils daily.     [name]

## 2017-07-26 ENCOUNTER — Other Ambulatory Visit: Payer: Self-pay | Admitting: Family Medicine

## 2017-07-26 NOTE — Telephone Encounter (Signed)
Refill request for augmentin 875-125 mg denied.  Pt needs appt.  Will send to schedulers to call pt for appt.

## 2017-07-30 NOTE — Telephone Encounter (Signed)
lmom for pt to give [place] a call to schedule an OV for med refill

## 2017-12-23 ENCOUNTER — Other Ambulatory Visit: Payer: Self-pay | Admitting: Family Medicine

## 2017-12-23 DIAGNOSIS — Z1231 Encounter for screening mammogram for malignant neoplasm of breast: Secondary | ICD-10-CM

## 2018-01-05 ENCOUNTER — Other Ambulatory Visit: Payer: Self-pay | Admitting: Family Medicine

## 2018-01-08 ENCOUNTER — Other Ambulatory Visit: Payer: Self-pay

## 2018-01-08 ENCOUNTER — Encounter: Payer: Self-pay | Admitting: Family Medicine

## 2018-01-08 ENCOUNTER — Other Ambulatory Visit: Payer: Self-pay | Admitting: Family Medicine

## 2018-01-08 NOTE — Telephone Encounter (Signed)
Refill of Prempro  LOV 07/02/17  Dr. Tamala Julian  Pt has appt for both mammogram and pap on 01/13/18.

## 2018-01-09 ENCOUNTER — Ambulatory Visit: Payer: BLUE CROSS/BLUE SHIELD

## 2018-01-13 ENCOUNTER — Ambulatory Visit
Admission: RE | Admit: 2018-01-13 | Discharge: 2018-01-13 | Disposition: A | Discharge status: Home / Self Care | Payer: Commercial Managed Care - PPO | Source: Ambulatory Visit | Attending: Family Medicine | Admitting: Family Medicine

## 2018-01-13 DIAGNOSIS — Z1231 Encounter for screening mammogram for malignant neoplasm of breast: Secondary | ICD-10-CM

## 2018-01-14 ENCOUNTER — Encounter: Payer: Self-pay | Admitting: Family Medicine

## 2018-01-21 MED ORDER — CONJ ESTROG-MEDROXYPROGEST ACE 0.3-1.5 MG PO TABS: 1.0000 | ORAL_TABLET | Freq: Every day | ORAL | 0 refills | Status: DC

## 2018-01-27 ENCOUNTER — Other Ambulatory Visit: Payer: Self-pay

## 2018-01-27 ENCOUNTER — Encounter: Payer: Self-pay | Admitting: Family Medicine

## 2018-01-27 ENCOUNTER — Ambulatory Visit (INDEPENDENT_AMBULATORY_CARE_PROVIDER_SITE_OTHER): Payer: Commercial Managed Care - PPO | Admitting: Family Medicine

## 2018-01-27 VITALS — BP 114/62 | HR 80 | Temp 98.2°F | Resp 17 | Ht 62.0 in | Wt 164.4 lb

## 2018-01-27 DIAGNOSIS — E7800 Pure hypercholesterolemia, unspecified: Secondary | ICD-10-CM

## 2018-01-27 DIAGNOSIS — Z1211 Encounter for screening for malignant neoplasm of colon: Secondary | ICD-10-CM

## 2018-01-27 DIAGNOSIS — Z0000 Encounter for general adult medical examination without abnormal findings: Secondary | ICD-10-CM

## 2018-01-27 DIAGNOSIS — Z716 Tobacco abuse counseling: Secondary | ICD-10-CM

## 2018-01-27 DIAGNOSIS — Z6830 Body mass index (BMI) 30.0-30.9, adult: Secondary | ICD-10-CM

## 2018-01-27 DIAGNOSIS — Z780 Asymptomatic menopausal state: Secondary | ICD-10-CM

## 2018-01-27 DIAGNOSIS — Z800 Family history of malignant neoplasm of digestive organs: Secondary | ICD-10-CM | POA: Diagnosis not present

## 2018-01-27 DIAGNOSIS — Z1329 Encounter for screening for other suspected endocrine disorder: Secondary | ICD-10-CM

## 2018-01-27 DIAGNOSIS — Z131 Encounter for screening for diabetes mellitus: Secondary | ICD-10-CM

## 2018-01-27 DIAGNOSIS — E6609 Other obesity due to excess calories: Secondary | ICD-10-CM

## 2018-01-27 MED ORDER — CONJ ESTROG-MEDROXYPROGEST ACE 0.3-1.5 MG PO TABS: 1.0000 | ORAL_TABLET | ORAL | 1 refills | Status: DC

## 2018-01-27 MED ORDER — ATORVASTATIN CALCIUM 40 MG PO TABS: 40.0000 mg | ORAL_TABLET | Freq: Every day | ORAL | 3 refills | Status: DC

## 2018-01-27 NOTE — Patient Instructions (Addendum)
Preventive Care 65 Years and Older, Female Preventive care refers to lifestyle choices and visits with your health care provider that can promote health and wellness. What does preventive care include?  A yearly physical exam. This is also called an annual well check.  Dental exams once or twice a year.  Routine eye exams. Ask your health care provider how often you should have your eyes checked.  Personal lifestyle choices, including: ? Daily care of your teeth and gums. ? Regular physical activity. ? Eating a healthy diet. ? Avoiding tobacco and drug use. ? Limiting alcohol use. ? Practicing safe sex. ? Taking low-dose aspirin every day. ? Taking vitamin and mineral supplements as recommended by your health care provider. What happens during an annual well check? The services and screenings done by your health care provider during your annual well check will depend on your age, overall health, lifestyle risk factors, and family history of disease. Counseling Your health care provider may ask you questions about your:  Alcohol use.  Tobacco use.  Drug use.  Emotional well-being.  Home and relationship well-being.  Sexual activity.  Eating habits.  History of falls.  Memory and ability to understand (cognition).  Work and work environment.  Reproductive health.  Screening You may have the following tests or measurements:  Height, weight, and BMI.  Blood pressure.  Lipid and cholesterol levels. These may be checked every 5 years, or more frequently if you are over 50 years old.  Skin check.  Lung cancer screening. You may have this screening every year starting at age 55 if you have a 30-pack-year history of smoking and currently smoke or have quit within the past 15 years.  Fecal occult blood test (FOBT) of the stool. You may have this test every year starting at age 50.  Flexible sigmoidoscopy or colonoscopy. You may have a sigmoidoscopy every 5 years or  a colonoscopy every 10 years starting at age 50.  Hepatitis C blood test.  Hepatitis B blood test.  Sexually transmitted disease (STD) testing.  Diabetes screening. This is done by checking your blood sugar (glucose) after you have not eaten for a while (fasting). You may have this done every 1-3 years.  Bone density scan. This is done to screen for osteoporosis. You may have this done starting at age 65.  Mammogram. This may be done every 1-2 years. Talk to your health care provider about how often you should have regular mammograms.  Talk with your health care provider about your test results, treatment options, and if necessary, the need for more tests. Vaccines Your health care provider may recommend certain vaccines, such as:  Influenza vaccine. This is recommended every year.  Tetanus, diphtheria, and acellular pertussis (Tdap, Td) vaccine. You may need a Td booster every 10 years.  Varicella vaccine. You may need this if you have not been vaccinated.  Zoster vaccine. You may need this after age 60.  Measles, mumps, and rubella (MMR) vaccine. You may need at least one dose of MMR if you were born in 1957 or later. You may also need a second dose.  Pneumococcal 13-valent conjugate (PCV13) vaccine. One dose is recommended after age 65.  Pneumococcal polysaccharide (PPSV23) vaccine. One dose is recommended after age 65.  Meningococcal vaccine. You may need this if you have certain conditions.  Hepatitis A vaccine. You may need this if you have certain conditions or if you travel or work in places where you may be exposed to hepatitis   to hepatitis A.  Hepatitis B vaccine. You may need this if you have certain conditions or if you travel or work in places where you may be exposed to hepatitis B.  Haemophilus influenzae type b (Hib) vaccine. You may need this if you have certain conditions.  Talk to your health care provider about which screenings and vaccines you need and how  often you need them. This information is not intended to replace advice given to you by your health care provider. Make sure you discuss any questions you have with your health care provider. Document Released: 10/20/2015 Document Revised: 06/12/2016 Document Reviewed: 07/25/2015 Elsevier Interactive Patient Education  2018 Elsevier Inc.   IF you received an x-ray today, you will receive an invoice from [facility]. Please contact [facility] at [phone] with questions or concerns regarding your invoice.   IF you received labwork today, you will receive an invoice from [facility]. Please contact [facility] at [phone] with questions or concerns regarding your invoice.   Our billing staff will not be able to assist you with questions regarding bills from these companies.  You will be contacted with the lab results as soon as they are available. The fastest way to get your results is to activate your My Chart account. Instructions are located on the last page of this paperwork. If you have not heard from us regarding the results in 2 weeks, please contact this office.      

## 2018-01-27 NOTE — Progress Notes (Signed)
Subjective:    Patient ID: Jennifer Marquez, female    DOB: 03/25/1951, 67 y.o.   MRN: [id number]  01/27/2018  Annual Exam    HPI This 67 y.o. female presents for evaluation of COMPLETE PHYSICAL EXAMINATION.   Visual Acuity Screening   Right eye Left eye Both eyes  Without correction:     With correction: 20/20 20/20 20/20     BP Readings from Last 3 Encounters:  01/27/18 114/62  07/02/17 126/70  01/01/17 125/77   Wt Readings from Last 3 Encounters:  01/27/18 164 lb 6.4 oz (74.6 kg)  07/02/17 175 lb 9.6 oz (79.7 kg)  01/01/17 172 lb (78 kg)   Immunization History  Administered Date(s) Administered  . Influenza Split 07/07/2013, 07/07/2014  . Influenza, High Dose Seasonal PF 07/14/2015, 07/24/2016, 07/25/2017  . Pneumococcal Conjugate-13 12/28/2015  . Pneumococcal Polysaccharide-23 08/15/2009, 01/01/2017  . Tdap 09/01/2012  . Zoster 11/07/2012  . Zoster Recombinat (Shingrix) 01/07/2017   Health Maintenance  Topic Date Due  . INFLUENZA VACCINE  05/07/2018  . MAMMOGRAM  01/14/2020  . Fecal DNA (Cologuard)  02/20/2021  . TETANUS/TDAP  09/01/2022  . DEXA SCAN  Completed  . Hepatitis C Screening  Completed  . PNA vac Low Risk Adult  Completed   Review of Systems  Constitutional: Negative for activity change, appetite change, chills, diaphoresis, fatigue, fever and unexpected weight change.  HENT: Negative for congestion, dental problem, drooling, ear discharge, ear pain, facial swelling, hearing loss, mouth sores, nosebleeds, postnasal drip, rhinorrhea, sinus pressure, sneezing, sore throat, tinnitus, trouble swallowing and voice change.   Eyes: Negative for photophobia, pain, discharge, redness, itching and visual disturbance.  Respiratory: Negative for apnea, cough, choking, chest tightness, shortness of breath, wheezing and stridor.   Cardiovascular: Negative for chest pain, palpitations and leg swelling.  Gastrointestinal: Negative for abdominal distention,  abdominal pain, anal bleeding, blood in stool, constipation, diarrhea, nausea, rectal pain and vomiting.  Endocrine: Negative for cold intolerance, heat intolerance, polydipsia, polyphagia and polyuria.  Genitourinary: Negative for decreased urine volume, difficulty urinating, dyspareunia, dysuria, enuresis, flank pain, frequency, genital sores, hematuria, menstrual problem, pelvic pain, urgency, vaginal bleeding, vaginal discharge and vaginal pain.  Musculoskeletal: Negative for arthralgias, back pain, gait problem, joint swelling, myalgias, neck pain and neck stiffness.  Skin: Negative for color change, pallor, rash and wound.  Allergic/Immunologic: Negative for environmental allergies, food allergies and immunocompromised state.  Neurological: Negative for dizziness, tremors, seizures, syncope, facial asymmetry, speech difficulty, weakness, light-headedness, numbness and headaches.  Hematological: Negative for adenopathy. Does not bruise/bleed easily.  Psychiatric/Behavioral: Negative for agitation, behavioral problems, confusion, decreased concentration, dysphoric mood, hallucinations, self-injury, sleep disturbance and suicidal ideas. The patient is not nervous/anxious and is not hyperactive.     Past Medical History:  Diagnosis Date  . Hyperlipidemia    Past Surgical History:  Procedure Laterality Date  . BREAST BIOPSY Right 2016   benign  . TUBAL LIGATION     No Known Allergies No current outpatient medications on file prior to visit.   No current facility-administered medications on file prior to visit.    Social History   Socioeconomic History  . Marital status: Married    Spouse name: Not on file  . Number of children: Not on file  . Years of education: Not on file  . Highest education level: Not on file  Occupational History  . Occupation: Optometrist  Social Needs  . Financial resource strain: Not on file  . Food insecurity:    Worry:  Not on file    Inability: Not  on file  . Transportation needs:    Medical: Not on file    Non-medical: Not on file  Tobacco Use  . Smoking status: Current Every Day Smoker    Packs/day: 0.10  . Smokeless tobacco: Never Used  . Tobacco comment: would like to discuss  Substance and Sexual Activity  . Alcohol use: No  . Drug use: No  . Sexual activity: Never    Birth control/protection: Surgical, Post-menopausal  Lifestyle  . Physical activity:    Days per week: Not on file    Minutes per session: Not on file  . Stress: Not on file  Relationships  . Social connections:    Talks on phone: Not on file    Gets together: Not on file    Attends religious service: Not on file    Active member of club or organization: Not on file    Attends meetings of clubs or organizations: Not on file    Relationship status: Not on file  . Intimate partner violence:    Fear of current or ex partner: Not on file    Emotionally abused: Not on file    Physically abused: Not on file    Forced sexual activity: Not on file  Other Topics Concern  . Not on file  Social History Narrative   Marital status: married x 30 years     Children: 2 sons; 8 grandchildren; 2 gg.      Lives: with husband      Employment: Engineer, maintenance work. Loves work; Public librarian.      Education:  High School      Tobacco: 8 cigarettes per day x 20 years      Alcohol: none      Drugs: none      Exercise: none in 2019     Seatbelt:  100%; no texting.   Family History  Problem Relation Age of Onset  . Hypertension Mother   . Stroke Mother 62       TIA/CVA  . Cancer Father 58       esophageal cancer  . Cancer Brother 64       colon cancer  . Heart disease Brother   . COPD Brother        Objective:    BP 114/62   Pulse 80   Temp 98.2 F (36.8 C) (Oral)   Resp 17   Ht 5\' 2"  (1.575 m)   Wt 164 lb 6.4 oz (74.6 kg)   SpO2 95%   BMI 30.07 kg/m  Physical Exam  Constitutional: She is oriented to person, place, and time. She  appears well-developed and well-nourished. No distress.  HENT:  Head: Normocephalic and atraumatic.  Right Ear: External ear normal.  Left Ear: External ear normal.  Nose: Nose normal.  Mouth/Throat: Oropharynx is clear and moist.  Eyes: Pupils are equal, round, and reactive to light. Conjunctivae and EOM are normal.  Neck: Normal range of motion. Neck supple. Carotid bruit is not present. No thyromegaly present.  Cardiovascular: Normal rate, regular rhythm, normal heart sounds and intact distal pulses. Exam reveals no gallop and no friction rub.  No murmur heard. Pulmonary/Chest: Effort normal and breath sounds normal. She has no wheezes. She has no rales.  Abdominal: Soft. Bowel sounds are normal. She exhibits no distension and no mass. There is no tenderness. There is no rebound and no guarding.  Musculoskeletal:  Right shoulder: Normal.       Left shoulder: Normal.       Cervical back: Normal.  Lymphadenopathy:    She has no cervical adenopathy.  Neurological: She is alert and oriented to person, place, and time. She has normal reflexes. No cranial nerve deficit. She exhibits normal muscle tone. Coordination normal.  Skin: Skin is warm and dry. No rash noted. She is not diaphoretic.  Psychiatric: She has a normal mood and affect. Her behavior is normal. Judgment and thought content normal.   No results found. Depression screen [facility] 2/9 01/27/2018 07/02/2017 01/01/2017 06/28/2016 12/28/2015  Decreased Interest 0 0 0 0 0  Down, Depressed, Hopeless 0 0 0 0 0  PHQ - 2 Score 0 0 0 0 0   Fall Risk  01/27/2018 07/02/2017 01/01/2017 06/28/2016 12/28/2015  Falls in the past year? No No No No No        Assessment & Plan:   1. Routine physical examination   2. Pure hypercholesterolemia   3. Family history of colon cancer   4. Screening for diabetes mellitus   5. Screening for thyroid disorder   6. Colon cancer screening   7. Tobacco abuse counseling   8. Class 1 obesity due to excess  calories with serious comorbidity and body mass index (BMI) of 30.0 to 30.9 in adult   9. Menopause     -anticipatory guidance provided --- exercise, weight loss, safe driving practices, aspirin 81mg  daily. -obtain age appropriate screening labs and labs for chronic disease management. -Brother with colon cancer age 46; pt refusing colonoscopy; cologuard provided yet inferior screening due to family history. Smoking cessation instruction/counseling given:  counseled patient on the dangers of tobacco use, advised patient to stop smoking, and reviewed strategies to maximize success.  Menopausal hot flashes: counseled regarding risk of HRT; wean to every other day with plan to discontinue to off in upcoming year. Obesity: Recommend weight loss, exercise for 30-60 minutes five days per week; recommend 1200 kcal restriction per day with a minimum of 60 grams of protein per day.  Eat 3 meals per day. Do not skip meals. Consider having a protein shake as a meal replacement to aid with eliminating meal skipping. Look for products with <220 calories, <7 gm sugar, and 20-30 gm protein.  Eat breakfast within 2 hours of getting up.   Make  your plate non-starchy vegetables,  protein, and  carbohydrates at lunch and dinner.   Aim for at least 64 oz. of calorie-free beverages daily (water, Crystal Light, diet green tea, etc.). Eliminate any sugary beverages such as regular soda, sweet tea, or fruit juice.   Pay attention to hunger and fullness cues.  Stop eating once you feel satisfied; don't wait until you feel full, stuffed, or sick from eating.  Choose lean meats and low fat/fat free dairy products.  Choose foods high in fiber such as fruits, vegetables, and whole grains (brown rice, whole wheat pasta, whole wheat bread, etc.).  Limit foods with added sugar to <7 gm per serving.  Always eat in the kitchen/dining room.  Never eat in the bedroom or in front of the TV.       Orders Placed This  Encounter  Procedures  . CBC with Differential/Platelet  . Comprehensive metabolic panel    Order Specific Question:   Has the patient fasted?    Answer:   No  . Lipid panel    Order Specific Question:   Has the patient fasted?  Answer:   No  . TSH  . Hemoglobin A1c  . Cologuard   Meds ordered this encounter  Medications  . estrogen, conjugated,-medroxyprogesterone (PREMPRO) 0.3-1.5 MG tablet    Sig: Take 1 tablet by mouth every other day.    Dispense:  90 tablet    Refill:  1  . atorvastatin (LIPITOR) 40 MG tablet    Sig: Take 1 tablet (40 mg total) by mouth daily.    Dispense:  90 tablet    Refill:  3    Return in about 1 year (around 01/28/2019) for complete physical examiniation.   Kristi Elayne Guerin, M.D. Primary [facility] at [facility] [address] [place], [place]  [address] [phone] phone [phone] fax

## 2018-01-28 LAB — CBC WITH DIFFERENTIAL/PLATELET
Basophils Absolute: 0.1 10*3/uL (ref 0.0–0.2)
Basos: 1 %
EOS (ABSOLUTE): 0.2 10*3/uL (ref 0.0–0.4)
Eos: 3 %
Hematocrit: 43.9 % (ref 34.0–46.6)
Hemoglobin: 14.5 g/dL (ref 11.1–15.9)
Immature Grans (Abs): 0 10*3/uL (ref 0.0–0.1)
Immature Granulocytes: 0 %
Lymphocytes Absolute: 2.4 10*3/uL (ref 0.7–3.1)
Lymphs: 32 %
MCH: 30.9 pg (ref 26.6–33.0)
MCHC: 33 g/dL (ref 31.5–35.7)
MCV: 93 fL (ref 79–97)
Monocytes Absolute: 0.4 10*3/uL (ref 0.1–0.9)
Monocytes: 5 %
Neutrophils Absolute: 4.5 10*3/uL (ref 1.4–7.0)
Neutrophils: 59 %
Platelets: 269 10*3/uL (ref 150–379)
RBC: 4.7 x10E6/uL (ref 3.77–5.28)
RDW: 13.3 % (ref 12.3–15.4)
WBC: 7.5 10*3/uL (ref 3.4–10.8)

## 2018-01-28 LAB — LIPID PANEL
Chol/HDL Ratio: 3.4 ratio (ref 0.0–4.4)
Cholesterol, Total: 156 mg/dL (ref 100–199)
HDL: 46 mg/dL (ref >39)
LDL Calculated: 84 mg/dL (ref 0–99)
Triglycerides: 128 mg/dL (ref 0–149)
VLDL Cholesterol Cal: 26 mg/dL (ref 5–40)

## 2018-01-28 LAB — COMPREHENSIVE METABOLIC PANEL
ALT: 16 IU/L (ref 0–32)
AST: 14 IU/L (ref 0–40)
Albumin/Globulin Ratio: 2.2 (ref 1.2–2.2)
Albumin: 4.6 g/dL (ref 3.6–4.8)
Alkaline Phosphatase: 84 IU/L (ref 39–117)
BUN/Creatinine Ratio: 14 (ref 12–28)
BUN: 12 mg/dL (ref 8–27)
Bilirubin Total: 0.4 mg/dL (ref 0.0–1.2)
CO2: 23 mmol/L (ref 20–29)
Calcium: 10 mg/dL (ref 8.7–10.3)
Chloride: 104 mmol/L (ref 96–106)
Creatinine, Ser: 0.85 mg/dL (ref 0.57–1.00)
GFR calc Af Amer: 81 mL/min/{1.73_m2} (ref >59)
GFR calc non Af Amer: 71 mL/min/{1.73_m2} (ref >59)
Globulin, Total: 2.1 g/dL (ref 1.5–4.5)
Glucose: 89 mg/dL (ref 65–99)
Potassium: 4.8 mmol/L (ref 3.5–5.2)
Sodium: 143 mmol/L (ref 134–144)
Total Protein: 6.7 g/dL (ref 6.0–8.5)

## 2018-01-28 LAB — TSH: TSH: 2.11 u[IU]/mL (ref 0.450–4.500)

## 2018-01-28 LAB — HEMOGLOBIN A1C
Est. average glucose Bld gHb Est-mCnc: 120 mg/dL
Hgb A1c MFr Bld: 5.8 % — ABNORMAL HIGH (ref 4.8–5.6)

## 2018-02-07 ENCOUNTER — Encounter: Payer: Self-pay | Admitting: Family Medicine

## 2018-02-19 ENCOUNTER — Encounter: Payer: Self-pay | Admitting: Family Medicine

## 2018-02-20 LAB — COLOGUARD: Cologuard: NEGATIVE

## 2018-02-23 ENCOUNTER — Encounter: Payer: Self-pay | Admitting: Family Medicine

## 2018-03-03 ENCOUNTER — Encounter: Payer: Self-pay | Admitting: Family Medicine

## 2018-04-02 ENCOUNTER — Encounter: Payer: Self-pay | Admitting: Family Medicine

## 2018-05-02 ENCOUNTER — Encounter: Payer: Self-pay | Admitting: Family Medicine

## 2018-05-06 ENCOUNTER — Telehealth: Payer: Self-pay | Admitting: Family Medicine

## 2018-05-06 NOTE — Telephone Encounter (Signed)
Copied from [place] [phone]. Topic: General - Other >> May 06, 2018 11:57 AM Lennox Solders wrote: Reason for CRM: pt has sent  email to dr Tamala Julian on 05-02-18. Pt would like to cancel prempro 0.3 mg rx and please send in new rx premarin 0.3 mg. Pt would like to switch due to cost. [facility] [place]

## 2018-05-06 NOTE — Telephone Encounter (Signed)
Please have patient schedule an appointment to discuss options. The reason she was prescribed prempro is because she still has a uterus and progesterone is needed to protect her some endometrial cancer. Premarin only has estrogen. Changing from prempro to premarin is not medically appropriate.   Thank you I Pamella Pert, MD

## 2018-05-09 ENCOUNTER — Ambulatory Visit (INDEPENDENT_AMBULATORY_CARE_PROVIDER_SITE_OTHER): Payer: Medicare Other | Admitting: Family Medicine

## 2018-05-09 ENCOUNTER — Other Ambulatory Visit: Payer: Self-pay

## 2018-05-09 ENCOUNTER — Encounter: Payer: Self-pay | Admitting: Family Medicine

## 2018-05-09 VITALS — BP 132/72 | HR 78 | Temp 98.6°F | Resp 16 | Ht 62.0 in | Wt 168.4 lb

## 2018-05-09 DIAGNOSIS — Z79890 Hormone replacement therapy (postmenopausal): Secondary | ICD-10-CM

## 2018-05-09 DIAGNOSIS — N951 Menopausal and female climacteric states: Secondary | ICD-10-CM

## 2018-05-09 MED ORDER — GABAPENTIN 300 MG PO CAPS: 300.0000 mg | ORAL_CAPSULE | Freq: Every day | ORAL | 3 refills | Status: DC

## 2018-05-09 NOTE — Progress Notes (Signed)
8/3/20192:29 PM  Jennifer Marquez 1951-09-13, 67 y.o. female [id number]  Chief Complaint  Patient presents with  . Follow-up    Medication follow up on Prempro    HPI:   Patient is a 67 y.o. female with past medical history significant for HLP, prediabetes and smoker on HRT who presents today for medication followup on prempro  Has been on prempro for over 10 years Plan was to start weaning this fall/winter Insurance not covering anymore Would like premarin Still has uterus Last prempro dose was 3 days ago, has only had nocturnal hot flashes She has never tried anything else for menopausal sx  Gets mammograms every year  Fall Risk  05/09/2018 01/27/2018 07/02/2017 01/01/2017 06/28/2016  Falls in the past year? No No No No No     Depression screen [facility] 2/9 05/09/2018 01/27/2018 07/02/2017  Decreased Interest 0 0 0  Down, Depressed, Hopeless 0 0 0  PHQ - 2 Score 0 0 0    No Known Allergies  Prior to Admission medications   Medication Sig Start Date End Date Taking? Authorizing Provider  atorvastatin (LIPITOR) 40 MG tablet Take 1 tablet (40 mg total) by mouth daily. 01/27/18  Yes Wardell Honour, MD    Past Medical History:  Diagnosis Date  . Hyperlipidemia   . Prediabetes     Past Surgical History:  Procedure Laterality Date  . BREAST BIOPSY Right 2016   benign  . TUBAL LIGATION      Social History   Tobacco Use  . Smoking status: Current Every Day Smoker    Packs/day: 0.10  . Smokeless tobacco: Never Used  . Tobacco comment: would like to discuss  Substance Use Topics  . Alcohol use: No    Family History  Problem Relation Age of Onset  . Hypertension Mother   . Stroke Mother 92       TIA/CVA  . Cancer Father 37       esophageal cancer  . Cancer Brother 1       colon cancer  . Heart disease Brother   . COPD Brother     ROS Per hpi  OBJECTIVE:  Blood pressure 132/72, pulse 78, temperature 98.6 F (37 C), resp. rate 16, height 5\' 2"  (1.575 m),  weight 168 lb 6.4 oz (76.4 kg), SpO2 93 %. Body mass index is 30.8 kg/m.   Physical Exam  Constitutional: She is oriented to person, place, and time. She appears well-developed and well-nourished.  HENT:  Head: Normocephalic and atraumatic.  Mouth/Throat: Mucous membranes are normal.  Eyes: Pupils are equal, round, and reactive to light. EOM are normal. No scleral icterus.  Neck: Neck supple.  Pulmonary/Chest: Effort normal.  Neurological: She is alert and oriented to person, place, and time.  Skin: Skin is warm and dry.  Psychiatric: She has a normal mood and affect.  Nursing note and vitals reviewed.    ASSESSMENT and PLAN  1. Hot flashes due to menopause 2. Hormone replacement therapy (HRT) More than 50% of this 25 min visit was spend discussing treatment options r/se/b of all. Patient decided trial of gabapentin given so far only nocturnal hot flashes. We will continue to re-eval control of sx.   Other orders - gabapentin (NEURONTIN) 300 MG capsule; Take 1-2 capsules (300-600 mg total) by mouth at bedtime.  Return in about 3 months (around 08/09/2018).    Rutherford Guys, MD Primary Care at [address], [place] [address] Ph.  [phone] Fax  [phone]   

## 2018-05-09 NOTE — Patient Instructions (Signed)
     IF you received an x-ray today, you will receive an invoice from [facility]. Please contact [facility] at [phone] with questions or concerns regarding your invoice.   IF you received labwork today, you will receive an invoice from [facility]. Please contact [facility] at [phone] with questions or concerns regarding your invoice.   Our billing staff will not be able to assist you with questions regarding bills from these companies.  You will be contacted with the lab results as soon as they are available. The fastest way to get your results is to activate your My Chart account. Instructions are located on the last page of this paperwork. If you have not heard from us regarding the results in 2 weeks, please contact this office.     

## 2018-07-22 ENCOUNTER — Ambulatory Visit (INDEPENDENT_AMBULATORY_CARE_PROVIDER_SITE_OTHER): Payer: Medicare Other | Admitting: Physician Assistant

## 2018-07-22 DIAGNOSIS — Z23 Encounter for immunization: Secondary | ICD-10-CM | POA: Diagnosis not present

## 2018-09-17 ENCOUNTER — Other Ambulatory Visit: Payer: Self-pay | Admitting: Family Medicine

## 2019-01-12 ENCOUNTER — Telehealth: Payer: Self-pay | Admitting: Registered Nurse

## 2019-01-12 NOTE — Telephone Encounter (Signed)
Spoke with pt to schedule AWV. Scheduled for 01/15/19 at 11:00am.  Pt expressed interest in getting gabapentin refill to [facility] on [address].

## 2019-01-13 MED ORDER — GABAPENTIN 300 MG PO CAPS: ORAL_CAPSULE | ORAL | 3 refills | Status: DC

## 2019-01-13 NOTE — Addendum Note (Signed)
Addended by: Rutherford Guys on: 01/13/2019 05:48 PM   Modules accepted: Orders

## 2019-01-15 ENCOUNTER — Other Ambulatory Visit: Payer: Self-pay

## 2019-01-15 ENCOUNTER — Ambulatory Visit: Payer: Medicare Other | Admitting: Registered Nurse

## 2019-01-22 ENCOUNTER — Other Ambulatory Visit: Payer: Self-pay

## 2019-01-22 ENCOUNTER — Encounter: Payer: Self-pay | Admitting: Registered Nurse

## 2019-01-22 ENCOUNTER — Ambulatory Visit (INDEPENDENT_AMBULATORY_CARE_PROVIDER_SITE_OTHER): Payer: Medicare Other | Admitting: Registered Nurse

## 2019-01-22 VITALS — BP 129/80 | Ht 62.0 in | Wt 168.0 lb

## 2019-01-22 DIAGNOSIS — Z0000 Encounter for general adult medical examination without abnormal findings: Secondary | ICD-10-CM

## 2019-01-22 NOTE — Patient Instructions (Signed)
  Jennifer Marquez , Thank you for taking time to come for your Medicare Wellness Visit. I appreciate your ongoing commitment to your health goals. Please review the following plan we discussed and let me know if I can assist you in the future.   These are the goals we discussed: Goals    . Exercise 150 min/wk Moderate Activity    . Quit Smoking    . Weight (lb) < 143 lb (64.9 kg)       This is a list of the screening recommended for you and due dates:  Health Maintenance  Topic Date Due  . Flu Shot  05/08/2019  . Mammogram  01/14/2020  . Cologuard (Stool DNA test)  02/20/2021  . Tetanus Vaccine  09/01/2022  . DEXA scan (bone density measurement)  Completed  .  Hepatitis C: One time screening is recommended by Center for Disease Control  (CDC) for  adults born from [birth year] through 1966.   Completed  . Pneumonia vaccines  Completed

## 2019-01-22 NOTE — Progress Notes (Signed)
Jennifer Marquez is a pleasant 68 y.o. female presenting today for Medicare Annual Wellness Visit. Jennifer Marquez understands that this visit is taking place the phone as a TeleHealth Visit. The patient understands that this takes place of an in-person visit. The patient was identified with her DOB and address prior to the start of the visit.    Date of last exam: No AWV on file.     Last seen 05/09/2018 by Dr. Pamella Marquez - Med Follow up Interpreter used for this visit? No  Patient Care Team: Jennifer Guys, MD as PCP - General (Family Medicine)   Other items to address today: We briefly discussed her new onset shoulder pain. It started within the past month without precipitating event. It is unilateral. It occurs when she flexes her shoulder above head level. It is intermittent. It is aggravated by movement. It is relieved by rest and tylenol as needed.  I discussed with the patient that, without an exam, rest with alternating ice and heat and tylenol as needed may provide relief. I encouraged her to make an appt post Covid-19 for an exam on the affected joint with a likely referral to PT.   Pt also had questions regarding the possibility of getting refills of gabapentin for 1 year vs 3 mos. Informed pt that Dr. Ardyth Marquez preferences are to see patients q22mos for medication management. Pt in agreement with plan.    Other Screening: Last screening for diabetes:  Lab Results  Component Value Date   HGBA1C 5.8 (H) 01/27/2018    Last lipid screening: Lab Results  Component Value Date   CHOL 156 01/27/2018   HDL 46 01/27/2018   LDLCALC 84 01/27/2018   TRIG 128 01/27/2018   CHOLHDL 3.4 01/27/2018     ADVANCE DIRECTIVES: Discussed: Yes On File: No Materials Provided: Will mail to patient  Immunization status:  Immunization History  Administered Date(s) Administered  . Influenza Split 07/07/2013, 07/07/2014  . Influenza, High Dose Seasonal PF 07/14/2015, 07/24/2016,  07/25/2017, 07/22/2018  . Pneumococcal Conjugate-13 12/28/2015  . Pneumococcal Polysaccharide-23 08/15/2009, 01/01/2017  . Tdap 09/01/2012  . Zoster 11/07/2012  . Zoster Recombinat (Shingrix) 01/07/2017     There are no preventive care reminders to display for this patient.   Functional Status Survey: Is the patient deaf or have difficulty hearing?: No Does the patient have difficulty seeing, even when wearing glasses/contacts?: No Does the patient have difficulty concentrating, remembering, or making decisions?: No Does the patient have difficulty walking or climbing stairs?: No Does the patient have difficulty dressing or bathing?: No Does the patient have difficulty doing errands alone such as visiting a doctor's office or shopping?: No Clinical Intake - 01/22/19 1107      Pre-visit preparation   Pre-visit preparation completed  No      Pain   Pain   No/denies pain      Nutrition Screen   Nutritional Status  BMI > 30  Obese    Diabetes  No      Functional Status   Activities of Daily Living  Independent    Ambulation  Independent    Medication Administration  Independent    Home Management  Independent      Risk/Barriers   Barriers to Care Management & Learning  None      Abuse/Neglect   Do you feel unsafe in your current relationship?  No    Do you feel physically threatened by others?  No  Anyone hurting you at home, work, or school?  No    Unable to ask?  No    Information provided on Community resources  No      Patient Literacy   How often do you need to have someone help you when you read instructions, pamphlets, or other written materials from your doctor or pharmacy?  1 - Never      Investment banker, operational Needed?  No       6CIT Screen 01/22/2019  What Year? 0 points  What month? 0 points  What time? 0 points  Count back from 20 0 points  Months in reverse 0 points  Repeat phrase 0 points  Total Score 0         Home Environment:  Pt lives with spouse of 30+ years. She feels it is a safe and comfortable relationship. Denies trip hazards at home.    Patient Active Problem List   Diagnosis Date Noted  . Family history of colon cancer 01/01/2017  . Menopause 09/01/2012  . Fibroids 09/01/2012  . Smoker 09/01/2012  . Hyperlipidemia 09/01/2012     Past Medical History:  Diagnosis Date  . Hyperlipidemia   . Prediabetes      Past Surgical History:  Procedure Laterality Date  . BREAST BIOPSY Right 2016   benign  . TUBAL LIGATION       Family History  Problem Relation Age of Onset  . Hypertension Mother   . Stroke Mother 68       TIA/CVA  . Cancer Father 51       esophageal cancer  . Cancer Brother 57       colon cancer  . Heart disease Brother   . COPD Brother      Social History   Socioeconomic History  . Marital status: Married    Spouse name: Not on file  . Number of children: Not on file  . Years of education: Not on file  . Highest education level: Not on file  Occupational History  . Occupation: Optometrist  Social Needs  . Financial resource strain: Not on file  . Food insecurity:    Worry: Not on file    Inability: Not on file  . Transportation needs:    Medical: Not on file    Non-medical: Not on file  Tobacco Use  . Smoking status: Current Every Day Smoker    Packs/day: 0.50    Years: 20.00    Pack years: 10.00    Types: Cigarettes  . Smokeless tobacco: Never Used  . Tobacco comment: would like to discuss  Substance and Sexual Activity  . Alcohol use: No  . Drug use: No  . Sexual activity: Not Currently    Birth control/protection: Surgical, Post-menopausal  Lifestyle  . Physical activity:    Days per week: Not on file    Minutes per session: Not on file  . Stress: Not on file  Relationships  . Social connections:    Talks on phone: Not on file    Gets together: Not on file    Attends religious service: Not on file    Active member of club or organization: Not  on file    Attends meetings of clubs or organizations: Not on file    Relationship status: Not on file  . Intimate partner violence:    Fear of current or ex partner: Not on file    Emotionally abused: Not on file  Physically abused: Not on file    Forced sexual activity: Not on file  Other Topics Concern  . Not on file  Social History Narrative   Marital status: married x 30 years     Children: 2 sons; 8 grandchildren; 2 gg.      Lives: with husband      Employment: Engineer, maintenance work. Loves work; Public librarian.      Education:  High School      Tobacco: 8 cigarettes per day x 20 years      Alcohol: none      Drugs: none      Exercise: none in 2019     Seatbelt:  100%; no texting.     No Known Allergies   Prior to Admission medications   Medication Sig Start Date End Date Taking? Authorizing Provider  atorvastatin (LIPITOR) 40 MG tablet Take 1 tablet (40 mg total) by mouth daily. 01/27/18  Yes Wardell Honour, MD  gabapentin (NEURONTIN) 300 MG capsule TAKE 1 TO 2 CAPSULES BY MOUTH AT BEDTIME 01/13/19  Yes Jennifer Guys, MD     Depression screen [facility] 2/9 01/22/2019 05/09/2018 01/27/2018 07/02/2017 01/01/2017  Decreased Interest 1 0 0 0 0  Down, Depressed, Hopeless 1 0 0 0 0  PHQ - 2 Score 2 0 0 0 0  Altered sleeping 1 - - - -  Tired, decreased energy 0 - - - -  Change in appetite 0 - - - -  Feeling bad or failure about yourself  0 - - - -  Trouble concentrating 0 - - - -  Moving slowly or fidgety/restless 0 - - - -  Suicidal thoughts 0 - - - -  PHQ-9 Score 3 - - - -  Difficult doing work/chores Not difficult at all - - - -     Fall Risk  01/22/2019 05/09/2018 01/27/2018 07/02/2017 01/01/2017  Falls in the past year? 0 No No No No  Number falls in past yr: 0 - - - -  Injury with Fall? 0 - - - -      PHYSICAL EXAM: BP 129/80   Ht 5\' 2"  (1.575 m)   Wt 168 lb (76.2 kg)   BMI 30.73 kg/m  These represent patient reported vital signs taken at home. As this  was a visit taking place via video chat, vital signs were not taken in office.   Wt Readings from Last 3 Encounters:  01/22/19 168 lb (76.2 kg)  05/09/18 168 lb 6.4 oz (76.4 kg)  01/27/18 164 lb 6.4 oz (74.6 kg)     No exam data present    Physical Exam As this visit was conducted by video chat, a physical exam did not take place.  Education/Counseling provided regarding diet and exercise, prevention of chronic diseases, smoking/tobacco cessation, if applicable, and reviewed "Covered Medicare Preventive Services."   ASSESSMENT/PLAN: 1. Medicare annual wellness visit, subsequent   Thank you for your visit with Primary Care at [facility] today.  Maximiano Coss, NP [facility] Primary Care at [address], [place] [address] [phone] - 0000

## 2019-02-16 ENCOUNTER — Encounter: Payer: Self-pay | Admitting: Family Medicine

## 2019-02-16 ENCOUNTER — Other Ambulatory Visit: Payer: Self-pay

## 2019-02-16 DIAGNOSIS — E7800 Pure hypercholesterolemia, unspecified: Secondary | ICD-10-CM

## 2019-02-16 MED ORDER — ATORVASTATIN CALCIUM 40 MG PO TABS: 40.0000 mg | ORAL_TABLET | Freq: Every day | ORAL | 0 refills | Status: DC

## 2019-02-16 NOTE — Telephone Encounter (Signed)
Please contact Jennifer Marquez to schedule her physical with Dr. Pamella Pert in the next month if possible.

## 2019-03-22 ENCOUNTER — Other Ambulatory Visit: Payer: Self-pay

## 2019-03-22 ENCOUNTER — Ambulatory Visit (INDEPENDENT_AMBULATORY_CARE_PROVIDER_SITE_OTHER): Payer: Medicare Other | Admitting: Registered Nurse

## 2019-03-22 ENCOUNTER — Encounter: Payer: Self-pay | Admitting: Registered Nurse

## 2019-03-22 VITALS — BP 136/70 | HR 80 | Temp 98.0°F | Resp 16 | Ht 62.6 in | Wt 160.0 lb

## 2019-03-22 DIAGNOSIS — Z7689 Persons encountering health services in other specified circumstances: Secondary | ICD-10-CM | POA: Diagnosis not present

## 2019-03-22 DIAGNOSIS — Z1329 Encounter for screening for other suspected endocrine disorder: Secondary | ICD-10-CM

## 2019-03-22 DIAGNOSIS — N951 Menopausal and female climacteric states: Secondary | ICD-10-CM

## 2019-03-22 DIAGNOSIS — Z131 Encounter for screening for diabetes mellitus: Secondary | ICD-10-CM | POA: Diagnosis not present

## 2019-03-22 DIAGNOSIS — M25512 Pain in left shoulder: Secondary | ICD-10-CM | POA: Diagnosis not present

## 2019-03-22 DIAGNOSIS — Z13228 Encounter for screening for other metabolic disorders: Secondary | ICD-10-CM

## 2019-03-22 DIAGNOSIS — E7800 Pure hypercholesterolemia, unspecified: Secondary | ICD-10-CM | POA: Diagnosis not present

## 2019-03-22 DIAGNOSIS — Z130 Encounter for screening for diseases of the blood and blood-forming organs and certain disorders involving the immune mechanism: Secondary | ICD-10-CM

## 2019-03-22 DIAGNOSIS — L989 Disorder of the skin and subcutaneous tissue, unspecified: Secondary | ICD-10-CM

## 2019-03-22 MED ORDER — GABAPENTIN 300 MG PO CAPS: ORAL_CAPSULE | ORAL | 1 refills | Status: DC

## 2019-03-22 MED ORDER — ATORVASTATIN CALCIUM 40 MG PO TABS: 40.0000 mg | ORAL_TABLET | Freq: Every day | ORAL | 1 refills | Status: DC

## 2019-03-22 NOTE — Patient Instructions (Signed)
° ° ° °  If you have lab work done today you will be contacted with your lab results within the next 2 weeks.  If you have not heard from us then please contact us. The fastest way to get your results is to register for My Chart. ° ° °IF you received an x-ray today, you will receive an invoice from [facility]. Please contact [facility] at [phone] with questions or concerns regarding your invoice.  ° °IF you received labwork today, you will receive an invoice from [facility]. Please contact [facility] at [phone] with questions or concerns regarding your invoice.  ° °Our billing staff will not be able to assist you with questions regarding bills from these companies. ° °You will be contacted with the lab results as soon as they are available. The fastest way to get your results is to activate your My Chart account. Instructions are located on the last page of this paperwork. If you have not heard from us regarding the results in 2 weeks, please contact this office. °  ° ° ° °

## 2019-03-22 NOTE — Progress Notes (Signed)
Established Patient Office Visit  Subjective:  Patient ID: Jennifer Marquez, female    DOB: 1951-04-24  Age: 68 y.o. MRN: [id number]  CC:  Chief Complaint  Patient presents with  . Annual Exam    HPI Jennifer Marquez presents for visit to establish care, medication refill, pain in L shoulder.  Pt reports that she has had pain in L shoulder for months. Limited ROM. Pain is worse when she lifts above head. No radiation/shooting pain. No weakness, tingling. Denies back pain. Denies injury.  Med refills: gabapentin 600mg  PO qpm for night sweats. Atorvastatin 40mg  PO qd for hypercholesterolemia. 6 mo supply given.   Pt also reports lesions in hair - has fam hx of skin cancer. Requests referral to derm.  Past Medical History:  Diagnosis Date  . Hyperlipidemia   . Prediabetes     Past Surgical History:  Procedure Laterality Date  . BREAST BIOPSY Right 2016   benign  . TUBAL LIGATION      Family History  Problem Relation Age of Onset  . Hypertension Mother   . Stroke Mother 78       TIA/CVA  . Cancer Father 74       esophageal cancer  . Cancer Brother 15       colon cancer  . Heart disease Brother   . COPD Brother     Social History   Socioeconomic History  . Marital status: Married    Spouse name: Not on file  . Number of children: Not on file  . Years of education: Not on file  . Highest education level: Not on file  Occupational History  . Occupation: retired  Scientific laboratory technician  . Financial resource strain: Not hard at all  . Food insecurity    Worry: Never true    Inability: Never true  . Transportation needs    Medical: No    Non-medical: No  Tobacco Use  . Smoking status: Current Every Day Smoker    Packs/day: 0.50    Years: 20.00    Pack years: 10.00    Types: Cigarettes  . Smokeless tobacco: Never Used  . Tobacco comment: would like to discuss  Substance and Sexual Activity  . Alcohol use: No  . Drug use: No  . Sexual activity: Not Currently   Birth control/protection: Surgical, Post-menopausal  Lifestyle  . Physical activity    Days per week: 0 days    Minutes per session: 0 min  . Stress: Rather much  Relationships  . Social Herbalist on phone: Three times a week    Gets together: Twice a week    Attends religious service: More than 4 times per year    Active member of club or organization: No    Attends meetings of clubs or organizations: Never    Relationship status: Married  . Intimate partner violence    Fear of current or ex partner: No    Emotionally abused: No    Physically abused: No    Forced sexual activity: No  Other Topics Concern  . Not on file  Social History Narrative   Marital status: married x 30 years     Children: 2 sons; 8 grandchildren; 2 gg.      Lives: with husband      Employment: Engineer, maintenance work. Loves work; Public librarian.      Education:  High School      Tobacco: 8 cigarettes per day x 20  years      Alcohol: none      Drugs: none      Exercise: none in 2019     Seatbelt:  100%; no texting.    Outpatient Medications Prior to Visit  Medication Sig Dispense Refill  . atorvastatin (LIPITOR) 40 MG tablet Take 1 tablet (40 mg total) by mouth daily. 30 tablet 0  . gabapentin (NEURONTIN) 300 MG capsule TAKE 1 TO 2 CAPSULES BY MOUTH AT BEDTIME 60 capsule 3   No facility-administered medications prior to visit.     No Known Allergies  ROS Review of Systems  Constitutional: Negative.   HENT: Negative.   Eyes: Negative.   Respiratory: Negative.   Cardiovascular: Negative.   Gastrointestinal: Negative.   Endocrine: Negative.   Genitourinary: Negative.   Musculoskeletal: Positive for arthralgias. Negative for back pain, joint swelling, myalgias, neck pain and neck stiffness.  Skin: Negative.   Allergic/Immunologic: Negative.   Neurological: Negative.   Hematological: Negative.   Psychiatric/Behavioral: Negative.  Negative for agitation.      Objective:     Physical Exam  Constitutional: She is oriented to person, place, and time. She appears well-developed and well-nourished.  HENT:  Head: Normocephalic and atraumatic.  Cardiovascular: Normal rate and regular rhythm.  Pulmonary/Chest: Effort normal. No respiratory distress.  Musculoskeletal:        General: No tenderness, deformity or edema.     Left shoulder: She exhibits decreased range of motion and pain. She exhibits no tenderness, no bony tenderness, no swelling, no effusion, no crepitus, no deformity, no laceration, no spasm, normal pulse and normal strength.  Neurological: She is alert and oriented to person, place, and time.  Skin: Skin is warm and dry. No rash noted. No erythema. No pallor.  Psychiatric: She has a normal mood and affect. Her behavior is normal. Judgment and thought content normal.  Nursing note and vitals reviewed.  Positive can emptying test Positive arm drop   BP 136/70   Pulse 80   Temp 98 F (36.7 C) (Oral)   Resp 16   Ht 5' 2.6" (1.59 m)   Wt 160 lb (72.6 kg)   SpO2 95%   BMI 28.71 kg/m  Wt Readings from Last 3 Encounters:  03/22/19 160 lb (72.6 kg)  01/22/19 168 lb (76.2 kg)  05/09/18 168 lb 6.4 oz (76.4 kg)     There are no preventive care reminders to display for this patient.  There are no preventive care reminders to display for this patient.  Lab Results  Component Value Date   TSH 2.110 01/27/2018   Lab Results  Component Value Date   WBC 7.5 01/27/2018   HGB 14.5 01/27/2018   HCT 43.9 01/27/2018   MCV 93 01/27/2018   PLT 269 01/27/2018   Lab Results  Component Value Date   NA 143 01/27/2018   K 4.8 01/27/2018   CO2 23 01/27/2018   GLUCOSE 89 01/27/2018   BUN 12 01/27/2018   CREATININE 0.85 01/27/2018   BILITOT 0.4 01/27/2018   ALKPHOS 84 01/27/2018   AST 14 01/27/2018   ALT 16 01/27/2018   PROT 6.7 01/27/2018   ALBUMIN 4.6 01/27/2018   CALCIUM 10.0 01/27/2018   Lab Results  Component Value Date   CHOL 156  01/27/2018   Lab Results  Component Value Date   HDL 46 01/27/2018   Lab Results  Component Value Date   LDLCALC 84 01/27/2018   Lab Results  Component Value Date  TRIG 128 01/27/2018   Lab Results  Component Value Date   CHOLHDL 3.4 01/27/2018   Lab Results  Component Value Date   HGBA1C 5.8 (H) 01/27/2018      Assessment & Plan:   Problem List Items Addressed This Visit      Other   Hyperlipidemia   Relevant Orders   Lipid panel    Other Visit Diagnoses    Encounter to establish care    -  Primary   Screening for diabetes mellitus       Relevant Orders   Hemoglobin A1c   Screening for thyroid disorder       Relevant Orders   TSH   Screening for endocrine, metabolic and immunity disorder       Relevant Orders   CBC with Differential/Platelet   Comprehensive metabolic panel   Hemoglobin A1c   TSH   Pain in joint of left shoulder       Relevant Orders   Ambulatory referral to Physical Therapy      No orders of the defined types were placed in this encounter.   Follow-up: No follow-ups on file.   PLAN:  Ref to PT for shoulder assessment - seems like likely rotator cuff etiology - pt in visible pain during active ROM.   Ref to derm - pt understands that derm is often a long wait for a visit and she is okay with this, she has not been seen before.  Meds refilled x 6 mo, may consider longer if lab work returns normal  Labs ordered, will f/u as warranted.  Patient encouraged to call clinic with any questions, comments, or concerns.   Maximiano Coss, NP

## 2019-03-23 LAB — COMPREHENSIVE METABOLIC PANEL WITH GFR
ALT: 20 IU/L (ref 0–32)
AST: 15 IU/L (ref 0–40)
Albumin/Globulin Ratio: 2.1 (ref 1.2–2.2)
Albumin: 4.7 g/dL (ref 3.8–4.8)
Alkaline Phosphatase: 101 IU/L (ref 39–117)
BUN/Creatinine Ratio: 17 (ref 12–28)
BUN: 13 mg/dL (ref 8–27)
Bilirubin Total: 0.5 mg/dL (ref 0.0–1.2)
CO2: 20 mmol/L (ref 20–29)
Calcium: 9.7 mg/dL (ref 8.7–10.3)
Chloride: 106 mmol/L (ref 96–106)
Creatinine, Ser: 0.75 mg/dL (ref 0.57–1.00)
GFR calc Af Amer: 94 mL/min/1.73
GFR calc non Af Amer: 82 mL/min/1.73
Globulin, Total: 2.2 g/dL (ref 1.5–4.5)
Glucose: 94 mg/dL (ref 65–99)
Potassium: 4.4 mmol/L (ref 3.5–5.2)
Sodium: 142 mmol/L (ref 134–144)
Total Protein: 6.9 g/dL (ref 6.0–8.5)

## 2019-03-23 LAB — LIPID PANEL
Chol/HDL Ratio: 3.2 ratio (ref 0.0–4.4)
Cholesterol, Total: 141 mg/dL (ref 100–199)
HDL: 44 mg/dL (ref >39)
LDL Calculated: 74 mg/dL (ref 0–99)
Triglycerides: 114 mg/dL (ref 0–149)
VLDL Cholesterol Cal: 23 mg/dL (ref 5–40)

## 2019-03-23 LAB — CBC WITH DIFFERENTIAL/PLATELET
Basophils Absolute: 0.1 10*3/uL (ref 0.0–0.2)
Basos: 1 %
EOS (ABSOLUTE): 0.2 10*3/uL (ref 0.0–0.4)
Eos: 2 %
Hematocrit: 43.5 % (ref 34.0–46.6)
Hemoglobin: 14.3 g/dL (ref 11.1–15.9)
Immature Grans (Abs): 0 10*3/uL (ref 0.0–0.1)
Immature Granulocytes: 0 %
Lymphocytes Absolute: 2.1 10*3/uL (ref 0.7–3.1)
Lymphs: 30 %
MCH: 30.8 pg (ref 26.6–33.0)
MCHC: 32.9 g/dL (ref 31.5–35.7)
MCV: 94 fL (ref 79–97)
Monocytes Absolute: 0.5 10*3/uL (ref 0.1–0.9)
Monocytes: 6 %
Neutrophils Absolute: 4.2 10*3/uL (ref 1.4–7.0)
Neutrophils: 61 %
Platelets: 251 10*3/uL (ref 150–450)
RBC: 4.64 x10E6/uL (ref 3.77–5.28)
RDW: 12.6 % (ref 11.7–15.4)
WBC: 7 10*3/uL (ref 3.4–10.8)

## 2019-03-23 LAB — HEMOGLOBIN A1C
Est. average glucose Bld gHb Est-mCnc: 120 mg/dL
Hgb A1c MFr Bld: 5.8 % — ABNORMAL HIGH (ref 4.8–5.6)

## 2019-03-23 LAB — TSH: TSH: 1.42 u[IU]/mL (ref 0.450–4.500)

## 2019-04-08 ENCOUNTER — Encounter: Payer: Self-pay | Admitting: Registered Nurse

## 2019-06-29 ENCOUNTER — Other Ambulatory Visit: Payer: Self-pay

## 2019-06-29 ENCOUNTER — Ambulatory Visit (INDEPENDENT_AMBULATORY_CARE_PROVIDER_SITE_OTHER): Payer: Medicare Other | Admitting: Family Medicine

## 2019-06-29 DIAGNOSIS — Z23 Encounter for immunization: Secondary | ICD-10-CM

## 2019-06-29 NOTE — Patient Instructions (Signed)
° ° ° °  If you have lab work done today you will be contacted with your lab results within the next 2 weeks.  If you have not heard from us then please contact us. The fastest way to get your results is to register for My Chart. ° ° °IF you received an x-ray today, you will receive an invoice from [facility]. Please contact [facility] at [phone] with questions or concerns regarding your invoice.  ° °IF you received labwork today, you will receive an invoice from [facility]. Please contact [facility] at [phone] with questions or concerns regarding your invoice.  ° °Our billing staff will not be able to assist you with questions regarding bills from these companies. ° °You will be contacted with the lab results as soon as they are available. The fastest way to get your results is to activate your My Chart account. Instructions are located on the last page of this paperwork. If you have not heard from us regarding the results in 2 weeks, please contact this office. °  ° ° ° °

## 2019-07-04 ENCOUNTER — Other Ambulatory Visit: Payer: Self-pay | Admitting: Registered Nurse

## 2019-07-04 DIAGNOSIS — N951 Menopausal and female climacteric states: Secondary | ICD-10-CM

## 2019-07-05 NOTE — Telephone Encounter (Signed)
Requested medication (s) are due for refill today: yes  Requested medication (s) are on the active medication list: yes  Last refill:  05/04/2019  Future visit scheduled: no  Notes to clinic:  Ordering provider and pcp are different   Requested Prescriptions  Pending Prescriptions Disp Refills   gabapentin (NEURONTIN) 300 MG capsule [Pharmacy Med Name: GABAPENTIN 300 MG CAPSULE] 90 capsule 1    Sig: TAKE 1 TO 2 CAPSULES BY MOUTH AT BEDTIME     Neurology: Anticonvulsants - gabapentin Passed - 07/04/2019  5:16 PM      Passed - Valid encounter within last 12 months    Recent Outpatient Visits          6 days ago Need for prophylactic vaccination and inoculation against influenza   [facility], Lilia Argue, MD   3 months ago Encounter to establish care   [facility], Delfino Lovett, NP   1 year ago Hot flashes due to menopause   [facility], Lilia Argue, MD   1 year ago Routine physical examination   [facility] at [facility], Renette Butters, MD   2 years ago Sinus congestion   [facility] at [facility], Arlie Solomons, MD

## 2019-07-07 ENCOUNTER — Other Ambulatory Visit: Payer: Self-pay

## 2019-07-07 DIAGNOSIS — E7800 Pure hypercholesterolemia, unspecified: Secondary | ICD-10-CM

## 2019-07-07 MED ORDER — ATORVASTATIN CALCIUM 40 MG PO TABS: 40.0000 mg | ORAL_TABLET | Freq: Every day | ORAL | 1 refills | Status: DC

## 2019-08-30 DIAGNOSIS — L821 Other seborrheic keratosis: Secondary | ICD-10-CM | POA: Diagnosis not present

## 2019-08-30 DIAGNOSIS — D2262 Melanocytic nevi of left upper limb, including shoulder: Secondary | ICD-10-CM | POA: Diagnosis not present

## 2019-08-30 DIAGNOSIS — D2239 Melanocytic nevi of other parts of face: Secondary | ICD-10-CM | POA: Diagnosis not present

## 2019-08-30 DIAGNOSIS — D225 Melanocytic nevi of trunk: Secondary | ICD-10-CM | POA: Diagnosis not present

## 2019-08-30 DIAGNOSIS — D485 Neoplasm of uncertain behavior of skin: Secondary | ICD-10-CM | POA: Diagnosis not present

## 2019-08-30 DIAGNOSIS — D2261 Melanocytic nevi of right upper limb, including shoulder: Secondary | ICD-10-CM | POA: Diagnosis not present

## 2019-08-30 DIAGNOSIS — L570 Actinic keratosis: Secondary | ICD-10-CM | POA: Diagnosis not present

## 2019-09-02 ENCOUNTER — Other Ambulatory Visit: Payer: Self-pay | Admitting: Registered Nurse

## 2019-09-02 DIAGNOSIS — E7800 Pure hypercholesterolemia, unspecified: Secondary | ICD-10-CM

## 2019-12-04 ENCOUNTER — Other Ambulatory Visit: Payer: Self-pay | Admitting: Family Medicine

## 2019-12-04 DIAGNOSIS — E7800 Pure hypercholesterolemia, unspecified: Secondary | ICD-10-CM

## 2019-12-05 NOTE — Telephone Encounter (Signed)
Requested Prescriptions  Pending Prescriptions Disp Refills  . atorvastatin (LIPITOR) 40 MG tablet [Pharmacy Med Name: ATORVASTATIN  40MG   TAB] 90 tablet 0    Sig: TAKE 1 TABLET BY MOUTH  DAILY     Cardiovascular:  Antilipid - Statins Passed - 12/04/2019 11:01 PM      Passed - Total Cholesterol in normal range and within 360 days    Cholesterol, Total  Date Value Ref Range Status  03/22/2019 141 100 - 199 mg/dL Final         Passed - LDL in normal range and within 360 days    LDL Calculated  Date Value Ref Range Status  03/22/2019 74 0 - 99 mg/dL Final         Passed - HDL in normal range and within 360 days    HDL  Date Value Ref Range Status  03/22/2019 44 >39 mg/dL Final         Passed - Triglycerides in normal range and within 360 days    Triglycerides  Date Value Ref Range Status  03/22/2019 114 0 - 149 mg/dL Final         Passed - Patient is not pregnant      Passed - Valid encounter within last 12 months    Recent Outpatient Visits          5 months ago Need for prophylactic vaccination and inoculation against influenza   [facility], Lilia Argue, MD   8 months ago Encounter to establish care   [facility] [place], NP   1 year ago Hot flashes due to menopause   [facility], Lilia Argue, MD   1 year ago Routine physical examination   [facility] at [facility], Renette Butters, MD   2 years ago Sinus congestion   Primary [facility] at [facility], Arlie Solomons, MD

## 2019-12-23 ENCOUNTER — Other Ambulatory Visit: Payer: Self-pay | Admitting: Family Medicine

## 2019-12-23 DIAGNOSIS — E7800 Pure hypercholesterolemia, unspecified: Secondary | ICD-10-CM

## 2019-12-23 MED ORDER — ATORVASTATIN CALCIUM 40 MG PO TABS: 40.0000 mg | ORAL_TABLET | Freq: Every day | ORAL | 0 refills | Status: DC

## 2020-01-18 ENCOUNTER — Other Ambulatory Visit: Payer: Self-pay | Admitting: Family Medicine

## 2020-01-18 DIAGNOSIS — Z1231 Encounter for screening mammogram for malignant neoplasm of breast: Secondary | ICD-10-CM

## 2020-01-26 ENCOUNTER — Ambulatory Visit
Admission: RE | Admit: 2020-01-26 | Discharge: 2020-01-26 | Disposition: A | Discharge status: Home / Self Care | Payer: Medicare Other | Source: Ambulatory Visit | Attending: Family Medicine | Admitting: Family Medicine

## 2020-01-26 ENCOUNTER — Other Ambulatory Visit: Payer: Self-pay

## 2020-01-26 DIAGNOSIS — Z1231 Encounter for screening mammogram for malignant neoplasm of breast: Secondary | ICD-10-CM | POA: Diagnosis not present

## 2020-01-27 ENCOUNTER — Other Ambulatory Visit: Payer: Self-pay | Admitting: Family Medicine

## 2020-01-27 DIAGNOSIS — H2513 Age-related nuclear cataract, bilateral: Secondary | ICD-10-CM | POA: Diagnosis not present

## 2020-01-27 DIAGNOSIS — R928 Other abnormal and inconclusive findings on diagnostic imaging of breast: Secondary | ICD-10-CM

## 2020-01-31 ENCOUNTER — Ambulatory Visit (INDEPENDENT_AMBULATORY_CARE_PROVIDER_SITE_OTHER): Payer: Medicare Other | Admitting: Family Medicine

## 2020-01-31 VITALS — BP 136/70 | Ht 62.0 in | Wt 160.0 lb

## 2020-01-31 DIAGNOSIS — Z0000 Encounter for general adult medical examination without abnormal findings: Secondary | ICD-10-CM

## 2020-01-31 NOTE — Progress Notes (Signed)
Presents today for [employer] Visit   Date of last exam: 03-22-2019  Interpreter used for this visit? No  I connected with  Jennifer Marquez on 01/31/20 by a telephone  and verified that I am speaking with the correct person using two identifiers.   I discussed the limitations of evaluation and management by telemedicine. The patient expressed understanding and agreed to proceed.    Patient Care Team: Rutherford Guys, MD as PCP - General (Family Medicine)   Other items to address today:   Discussed Immunizations Discussed Eye/Dental Patient will call and schedule appointment   Other Screening: Last screening for diabetes: 03/22/2019 Last lipid screening:  03/22/2019  ADVANCE DIRECTIVES: Discussed: yes On File: no Materials Provided: no  Patient has paperwork  Immunization status:  Immunization History  Administered Date(s) Administered  . Fluad Quad(high Dose 65+) 06/29/2019  . Influenza Split 07/07/2013, 07/07/2014  . Influenza, High Dose Seasonal PF 07/14/2015, 07/24/2016, 07/25/2017, 07/22/2018  . Pneumococcal Conjugate-13 12/28/2015  . Pneumococcal Polysaccharide-23 08/15/2009, 01/01/2017  . Tdap 09/01/2012  . Zoster 11/07/2012  . Zoster Recombinat (Shingrix) 01/07/2017     Health Maintenance Due  Topic Date Due  . COVID-19 Vaccine (1) Never done     Functional Status Survey: Is the patient deaf or have difficulty hearing?: No Does the patient have difficulty seeing, even when wearing glasses/contacts?: No Does the patient have difficulty concentrating, remembering, or making decisions?: No Does the patient have difficulty walking or climbing stairs?: No Does the patient have difficulty dressing or bathing?: No Does the patient have difficulty doing errands alone such as visiting a doctor's office or shopping?: No   6CIT Screen 01/31/2020 01/22/2019  What Year? 0 points 0 points  What month? 0 points 0 points  What time? 0 points  0 points  Count back from 20 0 points 0 points  Months in reverse 0 points 0 points  Repeat phrase 0 points 0 points  Total Score 0 0        Clinical Support from 01/31/2020 in [facility]  AUDIT-C Score  0       Home Environment:   Lives in a one story home No trouble climbing stairs No scattered rugs No grab bars Adequate lighting/ no clutter   Patient Active Problem List   Diagnosis Date Noted  . Family history of colon cancer 01/01/2017  . Menopause 09/01/2012  . Fibroids 09/01/2012  . Smoker 09/01/2012  . Hyperlipidemia 09/01/2012     Past Medical History:  Diagnosis Date  . Hyperlipidemia   . Prediabetes      Past Surgical History:  Procedure Laterality Date  . BREAST BIOPSY Right 2016   benign  . TUBAL LIGATION       Family History  Problem Relation Age of Onset  . Hypertension Mother   . Stroke Mother 46       TIA/CVA  . Cancer Father 50       esophageal cancer  . Cancer Brother 46       colon cancer  . Heart disease Brother   . COPD Brother      Social History   Socioeconomic History  . Marital status: Married    Spouse name: Not on file  . Number of children: Not on file  . Years of education: Not on file  . Highest education level: Not on file  Occupational History  . Occupation: retired  Tobacco Use  . Smoking status:  Current Every Day Smoker    Packs/day: 0.50    Years: 20.00    Pack years: 10.00    Types: Cigarettes  . Smokeless tobacco: Never Used  . Tobacco comment: would like to discuss  Substance and Sexual Activity  . Alcohol use: No  . Drug use: No  . Sexual activity: Not Currently    Birth control/protection: Surgical, Post-menopausal  Other Topics Concern  . Not on file  Social History Narrative   Marital status: married x 30 years     Children: 2 sons; 8 grandchildren; 2 gg.      Lives: with husband      Employment: Engineer, maintenance work. Loves work; Public librarian.      Education:   High School      Tobacco: 8 cigarettes per day x 20 years      Alcohol: none      Drugs: none      Exercise: none in 2019     Seatbelt:  100%; no texting.   Social Determinants of Health   Financial Resource Strain: Low Risk   . Difficulty of Paying Living Expenses: Not hard at all  Food Insecurity: No Food Insecurity  . Worried About Charity fundraiser in the Last Year: Never true  . Ran Out of Food in the Last Year: Never true  Transportation Needs: No Transportation Needs  . Lack of Transportation (Medical): No  . Lack of Transportation (Non-Medical): No  Physical Activity: Inactive  . Days of Exercise per Week: 0 days  . Minutes of Exercise per Session: 0 min  Stress: Stress Concern Present  . Feeling of Stress : Rather much  Social Connections: Slightly Isolated  . Frequency of Communication with Friends and Family: Three times a week  . Frequency of Social Gatherings with Friends and Family: Twice a week  . Attends Religious Services: More than 4 times per year  . Active Member of Clubs or Organizations: No  . Attends Archivist Meetings: Never  . Marital Status: Married  Human resources officer Violence: Not At Risk  . Fear of Current or Ex-Partner: No  . Emotionally Abused: No  . Physically Abused: No  . Sexually Abused: No     No Known Allergies   Prior to Admission medications   Medication Sig Start Date End Date Taking? Authorizing Provider  atorvastatin (LIPITOR) 40 MG tablet Take 1 tablet (40 mg total) by mouth daily. 12/23/19   Rutherford Guys, MD  gabapentin (NEURONTIN) 300 MG capsule TAKE 1 TO 2 CAPSULES BY MOUTH AT BEDTIME Patient not taking: Reported on 01/31/2020 03/22/19   Maximiano Coss, NP     Depression screen [facility] 2/9 01/31/2020 03/22/2019 01/22/2019 05/09/2018 01/27/2018  Decreased Interest 0 0 1 0 0  Down, Depressed, Hopeless 0 0 1 0 0  PHQ - 2 Score 0 0 2 0 0  Altered sleeping - - 1 - -  Tired, decreased energy - - 0 - -  Change in appetite  - - 0 - -  Feeling bad or failure about yourself  - - 0 - -  Trouble concentrating - - 0 - -  Moving slowly or fidgety/restless - - 0 - -  Suicidal thoughts - - 0 - -  PHQ-9 Score - - 3 - -  Difficult doing work/chores - - Not difficult at all - -     Fall Risk  01/31/2020 03/22/2019 01/22/2019 05/09/2018 01/27/2018  Falls in the past year? 0 0  0 No No  Number falls in past yr: 0 - 0 - -  Injury with Fall? 0 - 0 - -  Follow up Falls evaluation completed;Education provided - - - -      PHYSICAL EXAM: BP 136/70 Comment: taken from a previous visit  Ht 5\' 2"  (1.575 m)   Wt 160 lb (72.6 kg)   BMI 29.26 kg/m    Wt Readings from Last 3 Encounters:  01/31/20 160 lb (72.6 kg)  03/22/19 160 lb (72.6 kg)  01/22/19 168 lb (76.2 kg)    Medicare annual wellness visit, subsequent   Education/Counseling provided regarding diet and exercise, prevention of chronic diseases, smoking/tobacco cessation, if applicable, and reviewed "Covered Medicare Preventive Services."

## 2020-01-31 NOTE — Patient Instructions (Addendum)
Thank you for taking time to come for your Medicare Wellness Visit. I appreciate your ongoing commitment to your health goals. Please review the following plan we discussed and let me know if I can assist you in the future.  Aneeka Bowden LPN  Preventive Care 65 Years and Older, Female Preventive care refers to lifestyle choices and visits with your health care provider that can promote health and wellness. This includes:  A yearly physical exam. This is also called an annual well check.  Regular dental and eye exams.  Immunizations.  Screening for certain conditions.  Healthy lifestyle choices, such as diet and exercise. What can I expect for my preventive care visit? Physical exam Your health care provider will check:  Height and weight. These may be used to calculate body mass index (BMI), which is a measurement that tells if you are at a healthy weight.  Heart rate and blood pressure.  Your skin for abnormal spots. Counseling Your health care provider may ask you questions about:  Alcohol, tobacco, and drug use.  Emotional well-being.  Home and relationship well-being.  Sexual activity.  Eating habits.  History of falls.  Memory and ability to understand (cognition).  Work and work environment.  Pregnancy and menstrual history. What immunizations do I need?  Influenza (flu) vaccine  This is recommended every year. Tetanus, diphtheria, and pertussis (Tdap) vaccine  You may need a Td booster every 10 years. Varicella (chickenpox) vaccine  You may need this vaccine if you have not already been vaccinated. Zoster (shingles) vaccine  You may need this after age 60. Pneumococcal conjugate (PCV13) vaccine  One dose is recommended after age 70. Pneumococcal polysaccharide (PPSV23) vaccine  One dose is recommended after age 70. Measles, mumps, and rubella (MMR) vaccine  You may need at least one dose of MMR if you were born in 1957 or later. You may also  need a second dose. Meningococcal conjugate (MenACWY) vaccine  You may need this if you have certain conditions. Hepatitis A vaccine  You may need this if you have certain conditions or if you travel or work in places where you may be exposed to hepatitis A. Hepatitis B vaccine  You may need this if you have certain conditions or if you travel or work in places where you may be exposed to hepatitis B. Haemophilus influenzae type b (Hib) vaccine  You may need this if you have certain conditions. You may receive vaccines as individual doses or as more than one vaccine together in one shot (combination vaccines). Talk with your health care provider about the risks and benefits of combination vaccines. What tests do I need? Blood tests  Lipid and cholesterol levels. These may be checked every 5 years, or more frequently depending on your overall health.  Hepatitis C test.  Hepatitis B test. Screening  Lung cancer screening. You may have this screening every year starting at age 55 if you have a 30-pack-year history of smoking and currently smoke or have quit within the past 15 years.  Colorectal cancer screening. All adults should have this screening starting at age 50 and continuing until age 75. Your health care provider may recommend screening at age 45 if you are at increased risk. You will have tests every 1-10 years, depending on your results and the type of screening test.  Diabetes screening. This is done by checking your blood sugar (glucose) after you have not eaten for a while (fasting). You may have this done every 1-3   years.  Mammogram. This may be done every 1-2 years. Talk with your health care provider about how often you should have regular mammograms.  BRCA-related cancer screening. This may be done if you have a family history of breast, ovarian, tubal, or peritoneal cancers. Other tests  Sexually transmitted disease (STD) testing.  Bone density scan. This is done  to screen for osteoporosis. You may have this done starting at age 94. Follow these instructions at home: Eating and drinking  Eat a diet that includes fresh fruits and vegetables, whole grains, lean protein, and low-fat dairy products. Limit your intake of foods with high amounts of sugar, saturated fats, and salt.  Take vitamin and mineral supplements as recommended by your health care provider.  Do not drink alcohol if your health care provider tells you not to drink.  If you drink alcohol: ? Limit how much you have to 0-1 drink a day. ? Be aware of how much alcohol is in your drink. In the [place] one drink equals one 12 oz bottle of beer (355 mL), one 5 oz glass of wine (148 mL), or one 1 oz glass of hard liquor (44 mL). Lifestyle  Take daily care of your teeth and gums.  Stay active. Exercise for at least 30 minutes on 5 or more days each week.  Do not use any products that contain nicotine or tobacco, such as cigarettes, e-cigarettes, and chewing tobacco. If you need help quitting, ask your health care provider.  If you are sexually active, practice safe sex. Use a condom or other form of protection in order to prevent STIs (sexually transmitted infections).  Talk with your health care provider about taking a low-dose aspirin or statin. What's next?  Go to your health care provider once a year for a well check visit.  Ask your health care provider how often you should have your eyes and teeth checked.  Stay up to date on all vaccines. This information is not intended to replace advice given to you by your health care provider. Make sure you discuss any questions you have with your health care provider. Document Revised: 09/17/2018 Document Reviewed: 09/17/2018 Elsevier Patient Education  2020 [employer].

## 2020-02-01 ENCOUNTER — Other Ambulatory Visit: Payer: Self-pay

## 2020-02-01 ENCOUNTER — Encounter: Payer: Self-pay | Admitting: Family Medicine

## 2020-02-01 ENCOUNTER — Telehealth: Payer: Self-pay | Admitting: Family Medicine

## 2020-02-01 DIAGNOSIS — Z130 Encounter for screening for diseases of the blood and blood-forming organs and certain disorders involving the immune mechanism: Secondary | ICD-10-CM

## 2020-02-01 DIAGNOSIS — Z131 Encounter for screening for diabetes mellitus: Secondary | ICD-10-CM

## 2020-02-01 DIAGNOSIS — E7800 Pure hypercholesterolemia, unspecified: Secondary | ICD-10-CM

## 2020-02-01 DIAGNOSIS — Z13228 Encounter for screening for other metabolic disorders: Secondary | ICD-10-CM

## 2020-02-01 DIAGNOSIS — Z1329 Encounter for screening for other suspected endocrine disorder: Secondary | ICD-10-CM

## 2020-02-01 NOTE — Telephone Encounter (Signed)
Pt call said Doctor Layla Maw what her to have  blood work On 03/22/20 Please Advice

## 2020-02-01 NOTE — Telephone Encounter (Signed)
Please Advise

## 2020-02-03 ENCOUNTER — Ambulatory Visit
Admission: RE | Admit: 2020-02-03 | Discharge: 2020-02-03 | Disposition: A | Discharge status: Home / Self Care | Payer: Medicare Other | Source: Ambulatory Visit | Attending: Family Medicine | Admitting: Family Medicine

## 2020-02-03 ENCOUNTER — Other Ambulatory Visit: Payer: Self-pay

## 2020-02-03 ENCOUNTER — Other Ambulatory Visit: Payer: Self-pay | Admitting: Family Medicine

## 2020-02-03 DIAGNOSIS — R928 Other abnormal and inconclusive findings on diagnostic imaging of breast: Secondary | ICD-10-CM

## 2020-02-03 DIAGNOSIS — R921 Mammographic calcification found on diagnostic imaging of breast: Secondary | ICD-10-CM | POA: Diagnosis not present

## 2020-03-22 ENCOUNTER — Ambulatory Visit (INDEPENDENT_AMBULATORY_CARE_PROVIDER_SITE_OTHER): Payer: Medicare Other | Admitting: Registered Nurse

## 2020-03-22 ENCOUNTER — Other Ambulatory Visit: Payer: Self-pay

## 2020-03-22 DIAGNOSIS — Z131 Encounter for screening for diabetes mellitus: Secondary | ICD-10-CM | POA: Diagnosis not present

## 2020-03-22 DIAGNOSIS — Z1329 Encounter for screening for other suspected endocrine disorder: Secondary | ICD-10-CM | POA: Diagnosis not present

## 2020-03-22 DIAGNOSIS — E7800 Pure hypercholesterolemia, unspecified: Secondary | ICD-10-CM | POA: Diagnosis not present

## 2020-03-22 DIAGNOSIS — Z130 Encounter for screening for diseases of the blood and blood-forming organs and certain disorders involving the immune mechanism: Secondary | ICD-10-CM | POA: Diagnosis not present

## 2020-03-22 DIAGNOSIS — Z13228 Encounter for screening for other metabolic disorders: Secondary | ICD-10-CM

## 2020-03-23 LAB — CMP14+EGFR
ALT: 14 IU/L (ref 0–32)
AST: 13 IU/L (ref 0–40)
Albumin/Globulin Ratio: 1.8 (ref 1.2–2.2)
Albumin: 4.4 g/dL (ref 3.8–4.8)
Alkaline Phosphatase: 109 IU/L (ref 48–121)
BUN/Creatinine Ratio: 13 (ref 12–28)
BUN: 11 mg/dL (ref 8–27)
Bilirubin Total: 0.5 mg/dL (ref 0.0–1.2)
CO2: 25 mmol/L (ref 20–29)
Calcium: 9.9 mg/dL (ref 8.7–10.3)
Chloride: 107 mmol/L — ABNORMAL HIGH (ref 96–106)
Creatinine, Ser: 0.86 mg/dL (ref 0.57–1.00)
GFR calc Af Amer: 79 mL/min/{1.73_m2} (ref >59)
GFR calc non Af Amer: 69 mL/min/{1.73_m2} (ref >59)
Globulin, Total: 2.4 g/dL (ref 1.5–4.5)
Glucose: 88 mg/dL (ref 65–99)
Potassium: 4.8 mmol/L (ref 3.5–5.2)
Sodium: 146 mmol/L — ABNORMAL HIGH (ref 134–144)
Total Protein: 6.8 g/dL (ref 6.0–8.5)

## 2020-03-23 LAB — HEMOGLOBIN A1C
Est. average glucose Bld gHb Est-mCnc: 120 mg/dL
Hgb A1c MFr Bld: 5.8 % — ABNORMAL HIGH (ref 4.8–5.6)

## 2020-03-23 LAB — LIPID PANEL
Chol/HDL Ratio: 3 ratio (ref 0.0–4.4)
Cholesterol, Total: 155 mg/dL (ref 100–199)
HDL: 51 mg/dL (ref >39)
LDL Chol Calc (NIH): 88 mg/dL (ref 0–99)
Triglycerides: 84 mg/dL (ref 0–149)
VLDL Cholesterol Cal: 16 mg/dL (ref 5–40)

## 2020-03-23 LAB — TSH: TSH: 2.97 u[IU]/mL (ref 0.450–4.500)

## 2020-04-13 ENCOUNTER — Other Ambulatory Visit: Payer: Self-pay | Admitting: Family Medicine

## 2020-04-13 DIAGNOSIS — E7800 Pure hypercholesterolemia, unspecified: Secondary | ICD-10-CM

## 2020-04-29 ENCOUNTER — Other Ambulatory Visit: Payer: Self-pay | Admitting: Family Medicine

## 2020-04-29 DIAGNOSIS — E7800 Pure hypercholesterolemia, unspecified: Secondary | ICD-10-CM

## 2020-06-29 ENCOUNTER — Ambulatory Visit (INDEPENDENT_AMBULATORY_CARE_PROVIDER_SITE_OTHER): Payer: Medicare Other | Admitting: Family Medicine

## 2020-06-29 ENCOUNTER — Other Ambulatory Visit: Payer: Self-pay

## 2020-06-29 DIAGNOSIS — Z23 Encounter for immunization: Secondary | ICD-10-CM | POA: Diagnosis not present

## 2020-06-29 NOTE — Patient Instructions (Signed)
° ° ° °  If you have lab work done today you will be contacted with your lab results within the next 2 weeks.  If you have not heard from us then please contact us. The fastest way to get your results is to register for My Chart. ° ° °IF you received an x-ray today, you will receive an invoice from [facility]. Please contact  Radiology at [phone] with questions or concerns regarding your invoice.  ° °IF you received labwork today, you will receive an invoice from [facility]. Please contact [facility] at [phone] with questions or concerns regarding your invoice.  ° °Our billing staff will not be able to assist you with questions regarding bills from these companies. ° °You will be contacted with the lab results as soon as they are available. The fastest way to get your results is to activate your My Chart account. Instructions are located on the last page of this paperwork. If you have not heard from us regarding the results in 2 weeks, please contact this office. °  ° ° ° °

## 2020-09-05 ENCOUNTER — Other Ambulatory Visit: Payer: Self-pay

## 2020-09-05 ENCOUNTER — Ambulatory Visit
Admission: RE | Admit: 2020-09-05 | Discharge: 2020-09-05 | Disposition: A | Discharge status: Home / Self Care | Payer: Medicare Other | Source: Ambulatory Visit | Attending: Family Medicine | Admitting: Family Medicine

## 2020-09-05 DIAGNOSIS — R928 Other abnormal and inconclusive findings on diagnostic imaging of breast: Secondary | ICD-10-CM

## 2020-09-05 DIAGNOSIS — R921 Mammographic calcification found on diagnostic imaging of breast: Secondary | ICD-10-CM | POA: Diagnosis not present

## 2020-12-28 ENCOUNTER — Other Ambulatory Visit: Payer: Self-pay | Admitting: Registered Nurse

## 2020-12-28 DIAGNOSIS — R921 Mammographic calcification found on diagnostic imaging of breast: Secondary | ICD-10-CM

## 2021-01-11 ENCOUNTER — Encounter: Payer: Self-pay | Admitting: Registered Nurse

## 2021-01-11 ENCOUNTER — Other Ambulatory Visit: Payer: Self-pay | Admitting: Registered Nurse

## 2021-01-11 ENCOUNTER — Other Ambulatory Visit: Payer: Self-pay

## 2021-01-11 DIAGNOSIS — E7800 Pure hypercholesterolemia, unspecified: Secondary | ICD-10-CM

## 2021-01-11 DIAGNOSIS — N951 Menopausal and female climacteric states: Secondary | ICD-10-CM

## 2021-01-11 MED ORDER — GABAPENTIN 300 MG PO CAPS: ORAL_CAPSULE | ORAL | 1 refills | Status: DC

## 2021-01-11 MED ORDER — ATORVASTATIN CALCIUM 40 MG PO TABS: 1.0000 | ORAL_TABLET | Freq: Every day | ORAL | 1 refills | Status: DC

## 2021-01-11 NOTE — Telephone Encounter (Signed)
Patient need medication refill for Atorvastatin 40 mg. Patient is a [facility] patient and the medication was refilled by you once but she has not had labs since 03/22/2020. Would you like for me to call and see if she would like to schedule with you ?

## 2021-01-31 DIAGNOSIS — H2513 Age-related nuclear cataract, bilateral: Secondary | ICD-10-CM | POA: Diagnosis not present

## 2021-02-02 ENCOUNTER — Ambulatory Visit
Admission: RE | Admit: 2021-02-02 | Discharge: 2021-02-02 | Disposition: A | Discharge status: Home / Self Care | Payer: Medicare Other | Source: Ambulatory Visit | Attending: Registered Nurse | Admitting: Registered Nurse

## 2021-02-02 ENCOUNTER — Other Ambulatory Visit: Payer: Self-pay

## 2021-02-02 ENCOUNTER — Encounter: Payer: Self-pay | Admitting: Registered Nurse

## 2021-02-02 DIAGNOSIS — R921 Mammographic calcification found on diagnostic imaging of breast: Secondary | ICD-10-CM | POA: Diagnosis not present

## 2021-02-02 DIAGNOSIS — R922 Inconclusive mammogram: Secondary | ICD-10-CM | POA: Diagnosis not present

## 2021-02-07 ENCOUNTER — Telehealth: Payer: Self-pay | Admitting: *Deleted

## 2021-02-07 NOTE — Telephone Encounter (Signed)
Spoke to patient to give her the office number to [facility]. Patient message stated she needed to schedule yearly annual with Kathrin Ruddy NP. Per patient she has scheduled the appointment already.

## 2021-02-07 NOTE — Telephone Encounter (Signed)
Spoke to patient, she already scheduled appointment with Kathrin Ruddy NP.

## 2021-03-14 ENCOUNTER — Other Ambulatory Visit: Payer: Self-pay | Admitting: Registered Nurse

## 2021-03-14 DIAGNOSIS — N951 Menopausal and female climacteric states: Secondary | ICD-10-CM

## 2021-03-21 ENCOUNTER — Encounter: Payer: Self-pay | Admitting: Registered Nurse

## 2021-03-21 ENCOUNTER — Ambulatory Visit (INDEPENDENT_AMBULATORY_CARE_PROVIDER_SITE_OTHER): Payer: Medicare Other | Admitting: Registered Nurse

## 2021-03-21 ENCOUNTER — Other Ambulatory Visit: Payer: Self-pay

## 2021-03-21 VITALS — BP 119/57 | HR 66 | Temp 98.1°F | Resp 17 | Ht 62.0 in | Wt 152.2 lb

## 2021-03-21 DIAGNOSIS — Z130 Encounter for screening for diseases of the blood and blood-forming organs and certain disorders involving the immune mechanism: Secondary | ICD-10-CM | POA: Diagnosis not present

## 2021-03-21 DIAGNOSIS — Z13220 Encounter for screening for lipoid disorders: Secondary | ICD-10-CM

## 2021-03-21 DIAGNOSIS — Z1329 Encounter for screening for other suspected endocrine disorder: Secondary | ICD-10-CM

## 2021-03-21 DIAGNOSIS — Z1211 Encounter for screening for malignant neoplasm of colon: Secondary | ICD-10-CM

## 2021-03-21 DIAGNOSIS — Z0000 Encounter for general adult medical examination without abnormal findings: Secondary | ICD-10-CM | POA: Diagnosis not present

## 2021-03-21 DIAGNOSIS — Z716 Tobacco abuse counseling: Secondary | ICD-10-CM

## 2021-03-21 DIAGNOSIS — Z13228 Encounter for screening for other metabolic disorders: Secondary | ICD-10-CM

## 2021-03-21 DIAGNOSIS — Z7689 Persons encountering health services in other specified circumstances: Secondary | ICD-10-CM

## 2021-03-21 LAB — HEMOGLOBIN A1C: Hgb A1c MFr Bld: 6 % (ref 4.6–6.5)

## 2021-03-21 LAB — CBC WITH DIFFERENTIAL/PLATELET
Basophils Absolute: 0.1 10*3/uL (ref 0.0–0.1)
Basophils Relative: 1 % (ref 0.0–3.0)
Eosinophils Absolute: 0.1 10*3/uL (ref 0.0–0.7)
Eosinophils Relative: 1.8 % (ref 0.0–5.0)
HCT: 42.5 % (ref 36.0–46.0)
Hemoglobin: 14.4 g/dL (ref 12.0–15.0)
Lymphocytes Relative: 29 % (ref 12.0–46.0)
Lymphs Abs: 2.2 10*3/uL (ref 0.7–4.0)
MCHC: 33.9 g/dL (ref 30.0–36.0)
MCV: 91.7 fl (ref 78.0–100.0)
Monocytes Absolute: 0.5 10*3/uL (ref 0.1–1.0)
Monocytes Relative: 6.3 % (ref 3.0–12.0)
Neutro Abs: 4.6 10*3/uL (ref 1.4–7.7)
Neutrophils Relative %: 61.9 % (ref 43.0–77.0)
Platelets: 252 10*3/uL (ref 150.0–400.0)
RBC: 4.63 Mil/uL (ref 3.87–5.11)
RDW: 13.5 % (ref 11.5–15.5)
WBC: 7.5 10*3/uL (ref 4.0–10.5)

## 2021-03-21 LAB — COMPREHENSIVE METABOLIC PANEL
ALT: 15 U/L (ref 0–35)
AST: 13 U/L (ref 0–37)
Albumin: 4.6 g/dL (ref 3.5–5.2)
Alkaline Phosphatase: 96 U/L (ref 39–117)
BUN: 9 mg/dL (ref 6–23)
CO2: 28 mEq/L (ref 19–32)
Calcium: 9.8 mg/dL (ref 8.4–10.5)
Chloride: 104 mEq/L (ref 96–112)
Creatinine, Ser: 0.8 mg/dL (ref 0.40–1.20)
GFR: 74.17 mL/min (ref >60.00)
Glucose, Bld: 89 mg/dL (ref 70–99)
Potassium: 4.2 mEq/L (ref 3.5–5.1)
Sodium: 140 mEq/L (ref 135–145)
Total Bilirubin: 0.7 mg/dL (ref 0.2–1.2)
Total Protein: 7 g/dL (ref 6.0–8.3)

## 2021-03-21 LAB — TSH: TSH: 1.9 u[IU]/mL (ref 0.35–4.50)

## 2021-03-21 LAB — LIPID PANEL
Cholesterol: 157 mg/dL (ref 0–200)
HDL: 44.7 mg/dL (ref >39.00)
LDL Cholesterol: 83 mg/dL (ref 0–99)
NonHDL: 111.91
Total CHOL/HDL Ratio: 4
Triglycerides: 147 mg/dL (ref 0.0–149.0)
VLDL: 29.4 mg/dL (ref 0.0–40.0)

## 2021-03-21 NOTE — Progress Notes (Signed)
Established Patient Office Visit  Subjective:  Patient ID: Jennifer Marquez, female    DOB: 1951/08/23  Age: 70 y.o. MRN: [id number]  CC:  Chief Complaint  Patient presents with   Transitions Of Care    Patient states she is here for a TOC. Patient would also like getting a physical today.    HPI Jennifer Marquez presents for CPE and labs  No acute concerns  Due for colon cancer screening - cologuard normal in May 2019, wants to repeat this. No symptoms  Continues to smoke >1/2ppd. No symptoms. Wants to consider options for quitting including wellbutrin and varenicline. Requests information today.  Otherwise doing well.   Past Medical History:  Diagnosis Date   Hyperlipidemia    Prediabetes     Past Surgical History:  Procedure Laterality Date   BREAST BIOPSY Right 2016   benign   TUBAL LIGATION      Family History  Problem Relation Age of Onset   Hypertension Mother    Stroke Mother 26       TIA/CVA   Cancer Father 49       esophageal cancer   Cancer Brother 86       colon cancer   Heart disease Brother    COPD Brother     Social History   Socioeconomic History   Marital status: Married    Spouse name: Not on file   Number of children: Not on file   Years of education: Not on file   Highest education level: Not on file  Occupational History   Occupation: retired  Tobacco Use   Smoking status: Every Day    Packs/day: 0.50    Years: 20.00    Pack years: 10.00    Types: Cigarettes   Smokeless tobacco: Never   Tobacco comments:    would like to discuss  Vaping Use   Vaping Use: Never used  Substance and Sexual Activity   Alcohol use: No   Drug use: No   Sexual activity: Not Currently    Birth control/protection: Surgical, Post-menopausal  Other Topics Concern   Not on file  Social History Narrative   Marital status: married x 30 years     Children: 2 sons; 8 grandchildren; 2 gg.      Lives: with husband      Employment: Stage manager work. Loves work; Public librarian.      Education:  High School      Tobacco: 8 cigarettes per day x 20 years      Alcohol: none      Drugs: none      Exercise: none in 2019     Seatbelt:  100%; no texting.   Social Determinants of Health   Financial Resource Strain: Not on file  Food Insecurity: Not on file  Transportation Needs: Not on file  Physical Activity: Not on file  Stress: Not on file  Social Connections: Not on file  Intimate Partner Violence: Not on file    Outpatient Medications Prior to Visit  Medication Sig Dispense Refill   atorvastatin (LIPITOR) 40 MG tablet Take 1 tablet (40 mg total) by mouth daily. 90 tablet 1   gabapentin (NEURONTIN) 300 MG capsule TAKE 1 TO 2 CAPSULES BY  MOUTH AT BEDTIME 180 capsule 3   No facility-administered medications prior to visit.    No Known Allergies  ROS Review of Systems  Constitutional: Negative.   HENT: Negative.    Eyes: Negative.  Respiratory: Negative.    Cardiovascular: Negative.   Gastrointestinal: Negative.   Genitourinary: Negative.   Musculoskeletal: Negative.   Skin: Negative.   Neurological: Negative.   Psychiatric/Behavioral: Negative.    All other systems reviewed and are negative.    Objective:    Physical Exam Vitals and nursing note reviewed.  Constitutional:      General: She is not in acute distress.    Appearance: Normal appearance. She is normal weight. She is not ill-appearing, toxic-appearing or diaphoretic.  HENT:     Head: Normocephalic and atraumatic.     Right Ear: Tympanic membrane, ear canal and external ear normal. There is no impacted cerumen.     Left Ear: Tympanic membrane, ear canal and external ear normal. There is no impacted cerumen.     Nose: Nose normal. No congestion or rhinorrhea.     Mouth/Throat:     Mouth: Mucous membranes are moist.     Pharynx: Oropharynx is clear. No oropharyngeal exudate or posterior oropharyngeal erythema.  Eyes:     General:  No scleral icterus.       Right eye: No discharge.        Left eye: No discharge.     Extraocular Movements: Extraocular movements intact.     Conjunctiva/sclera: Conjunctivae normal.     Pupils: Pupils are equal, round, and reactive to light.  Cardiovascular:     Rate and Rhythm: Normal rate and regular rhythm.     Pulses: Normal pulses.     Heart sounds: Normal heart sounds. No murmur heard.   No friction rub. No gallop.     Comments: Varicose veins throughout bilateral lower extremities Pulmonary:     Effort: Pulmonary effort is normal. No respiratory distress.     Breath sounds: No stridor. Wheezing (resolved through exam) present. No rhonchi or rales.  Chest:     Chest wall: No tenderness.  Abdominal:     General: Abdomen is flat. Bowel sounds are normal. There is no distension.     Palpations: Abdomen is soft. There is no mass.     Tenderness: There is no abdominal tenderness. There is no right CVA tenderness, left CVA tenderness, guarding or rebound.     Hernia: No hernia is present.  Musculoskeletal:        General: No swelling, tenderness, deformity or signs of injury. Normal range of motion.     Right lower leg: No edema.     Left lower leg: No edema.  Skin:    General: Skin is warm and dry.     Capillary Refill: Capillary refill takes less than 2 seconds.     Coloration: Skin is not jaundiced or pale.     Findings: No bruising, erythema, lesion or rash.  Neurological:     General: No focal deficit present.     Mental Status: She is alert and oriented to person, place, and time. Mental status is at baseline.     Cranial Nerves: No cranial nerve deficit.     Sensory: No sensory deficit.     Motor: No weakness.     Coordination: Coordination normal.     Gait: Gait normal.     Deep Tendon Reflexes: Reflexes normal.  Psychiatric:        Mood and Affect: Mood normal.        Behavior: Behavior normal.        Thought Content: Thought content normal.        Judgment:  Judgment normal.  BP (!) 119/57   Pulse 66   Temp 98.1 F (36.7 C) (Temporal)   Resp 17   Ht 5\' 2"  (1.575 m)   Wt 152 lb 3.2 oz (69 kg)   SpO2 99%   BMI 27.84 kg/m  Wt Readings from Last 3 Encounters:  03/21/21 152 lb 3.2 oz (69 kg)  01/31/20 160 lb (72.6 kg)  03/22/19 160 lb (72.6 kg)     There are no preventive care reminders to display for this patient.  There are no preventive care reminders to display for this patient.  Lab Results  Component Value Date   TSH 2.970 03/22/2020   Lab Results  Component Value Date   WBC 7.0 03/22/2019   HGB 14.3 03/22/2019   HCT 43.5 03/22/2019   MCV 94 03/22/2019   PLT 251 03/22/2019   Lab Results  Component Value Date   NA 146 (H) 03/22/2020   K 4.8 03/22/2020   CO2 25 03/22/2020   GLUCOSE 88 03/22/2020   BUN 11 03/22/2020   CREATININE 0.86 03/22/2020   BILITOT 0.5 03/22/2020   ALKPHOS 109 03/22/2020   AST 13 03/22/2020   ALT 14 03/22/2020   PROT 6.8 03/22/2020   ALBUMIN 4.4 03/22/2020   CALCIUM 9.9 03/22/2020   Lab Results  Component Value Date   CHOL 155 03/22/2020   Lab Results  Component Value Date   HDL 51 03/22/2020   Lab Results  Component Value Date   LDLCALC 88 03/22/2020   Lab Results  Component Value Date   TRIG 84 03/22/2020   Lab Results  Component Value Date   CHOLHDL 3.0 03/22/2020   Lab Results  Component Value Date   HGBA1C 5.8 (H) 03/22/2020      Assessment & Plan:   Problem List Items Addressed This Visit   None Visit Diagnoses     Screening for endocrine, metabolic and immunity disorder    -  Primary   Relevant Orders   TSH   Hemoglobin A1c   Comprehensive metabolic panel   CBC with Differential/Platelet   Lipid screening       Relevant Orders   Lipid panel   Colon cancer screening       Relevant Orders   Cologuard       No orders of the defined types were placed in this encounter.   Follow-up: Return in about 6 months (around 09/20/2021) for chronic  conditions.   PLAN Wheezing on exam likely related to smoking vs allergies. Resolved with deep breathing. Encourage smoking cessation and provided with information on varenicline and bupropion Otherwise exam unremarkable Labs collected. Will follow up with the patient as warranted. Return in 6 mo for check on chronic conditions Patient encouraged to call clinic with any questions, comments, or concerns. Maximiano Coss, NP

## 2021-03-21 NOTE — Patient Instructions (Addendum)
Ms Jennifer Marquez to see you.  Exam today is reassuring.  Check out the attached info on Bupropion (Wellbutrin) and Varenicline (Chantix) for these medications that can help you quit smoking.  Labs today should be back soon  See you in 6 mo, sooner if you need anything  Thank you  Rich     If you have lab work done today you will be contacted with your lab results within the next 2 weeks.  If you have not heard from [place] then please contact us. The fastest way to get your results is to register for My Chart.   IF you received an x-ray today, you will receive an invoice from [facility]. Please contact [facility] at [phone] with questions or concerns regarding your invoice.   IF you received labwork today, you will receive an invoice from [facility]. Please contact [facility] at [phone] with questions or concerns regarding your invoice.   Our billing staff will not be able to assist you with questions regarding bills from these companies.  You will be contacted with the lab results as soon as they are available. The fastest way to get your results is to activate your My Chart account. Instructions are located on the last page of this paperwork. If you have not heard from [place] regarding the results in 2 weeks, please contact this office.

## 2021-03-31 IMAGING — MG DIGITAL DIAGNOSTIC UNILAT LEFT W/ CAD
3 series · 3 of 3 positions shown · non-contrast
Comparison: Previous exams including recent screening mammogram
dated 01/26/2020.

CLINICAL DATA: Patient returns today to evaluate LEFT breast
calcifications identified on recent screening mammogram.

EXAM:
DIGITAL DIAGNOSTIC LEFT MAMMOGRAM WITH CAD

[L ML (1 of 2)]
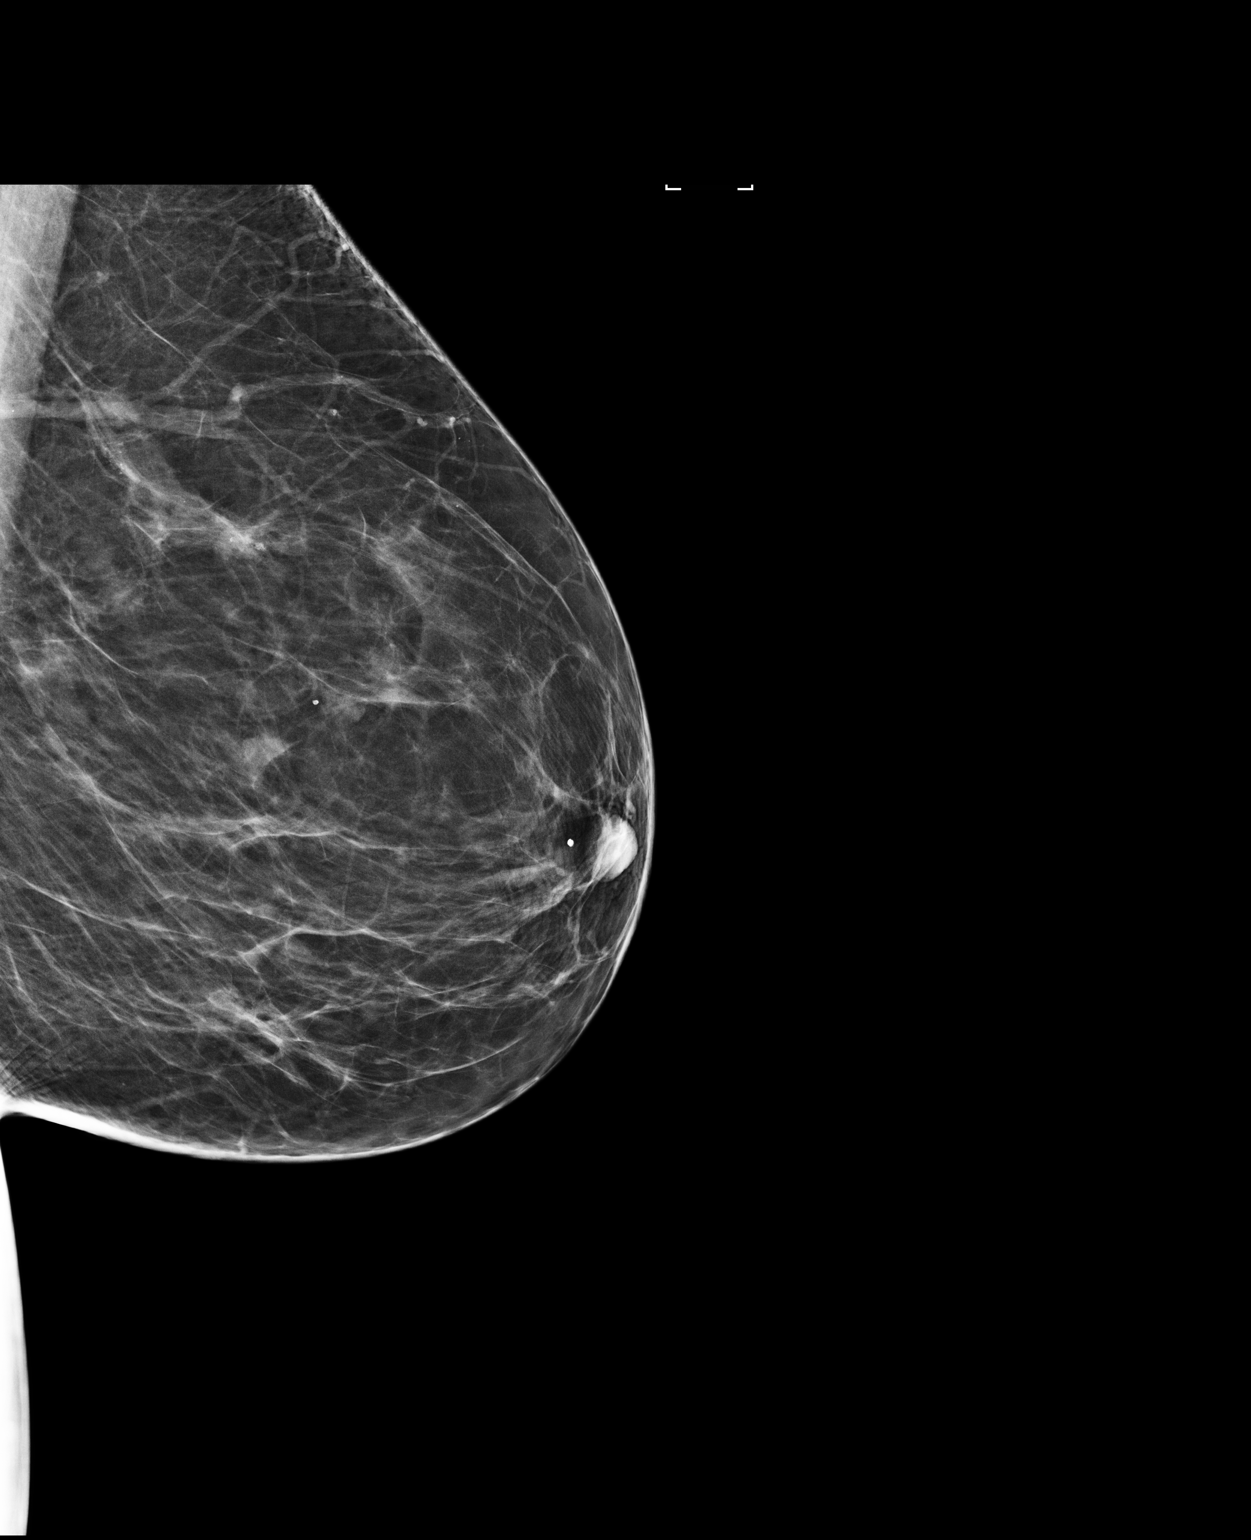

[L ML (2 of 2)]
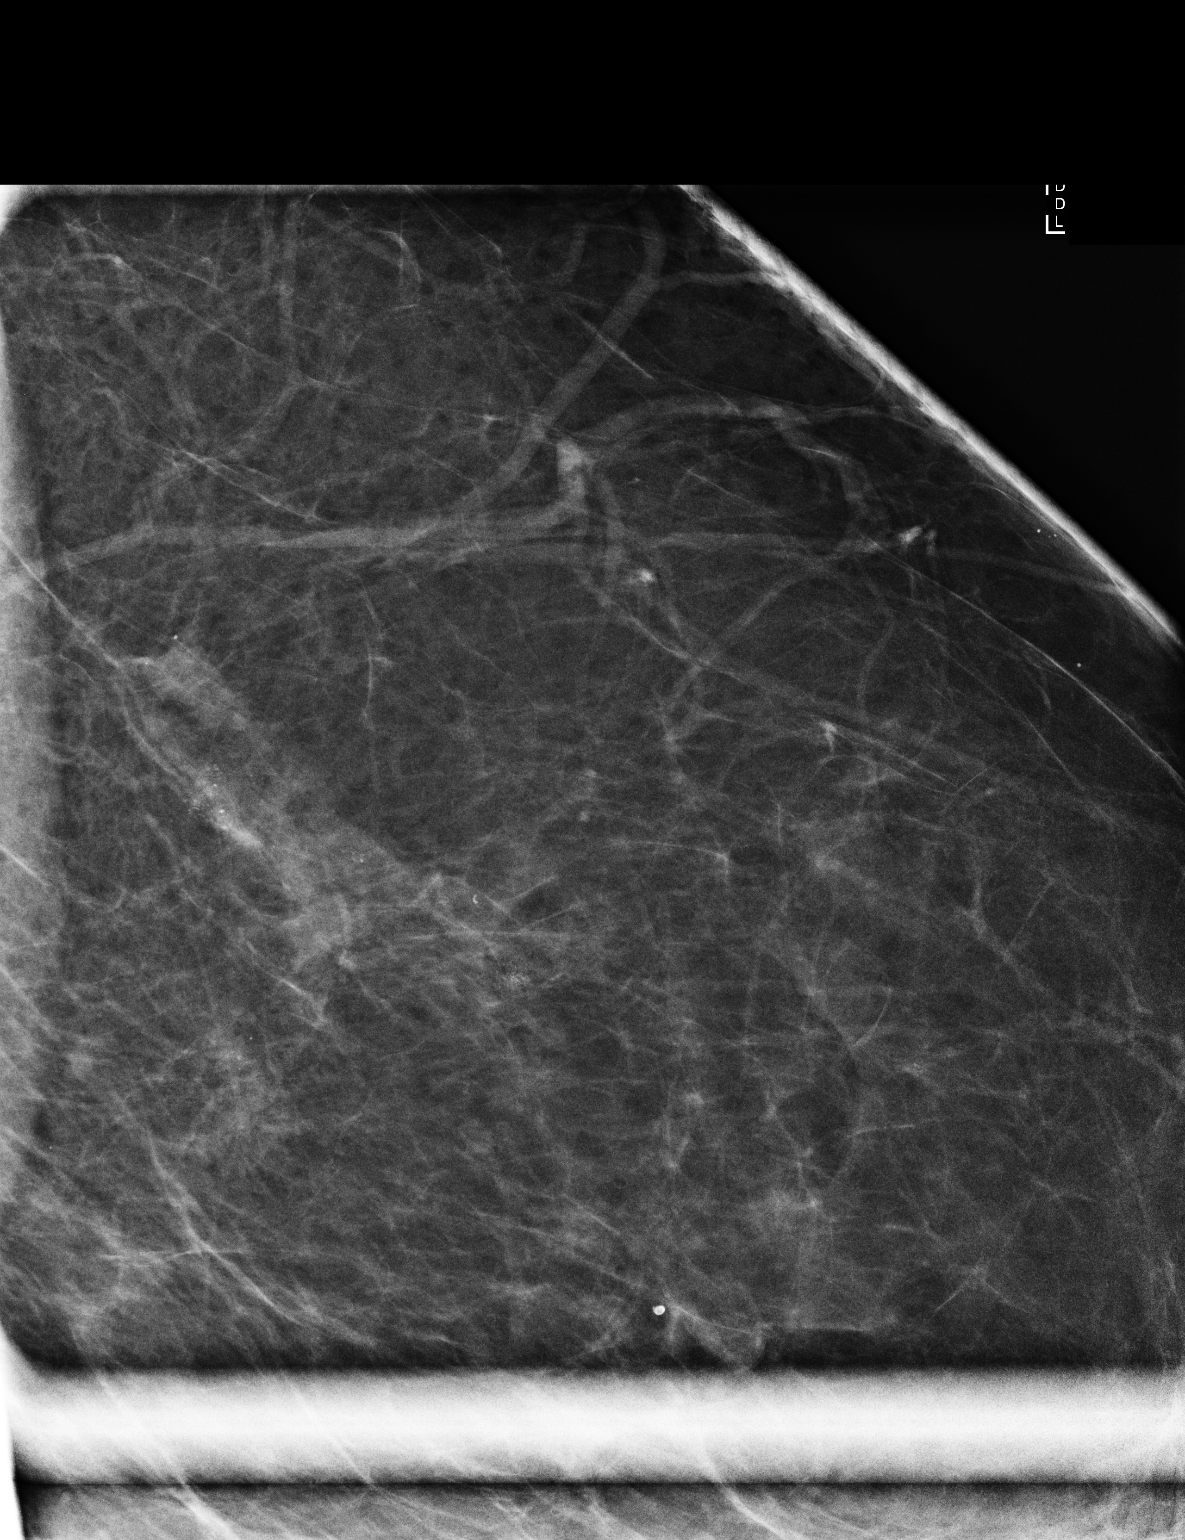

[L CC]
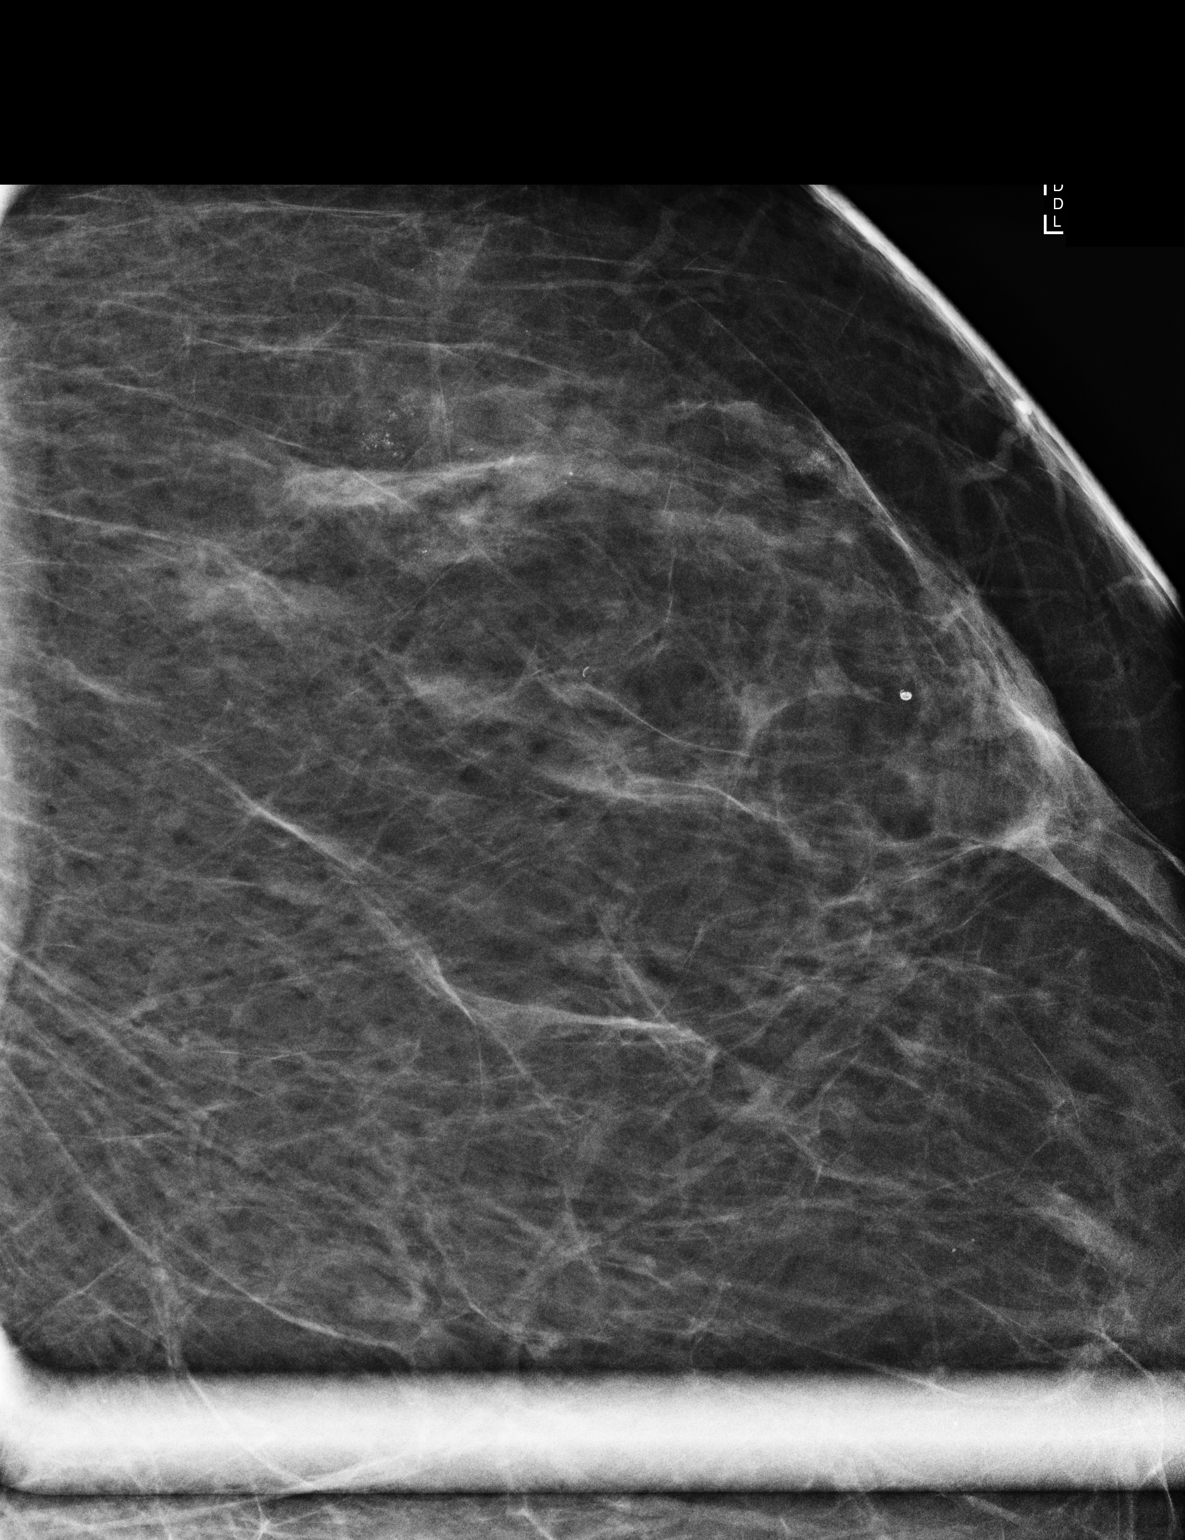

[3 of 3 positions shown; findings below may reference images not displayed]

ACR Breast Density Category b: There are scattered areas of
fibroglandular density.
FINDINGS: There are loosely grouped punctate calcifications within the
upper-outer quadrant of the LEFT breast. No suspicious pleomorphic
or fine linear branching calcifications are identified.

Mammographic images were processed with CAD.
IMPRESSION: Probably benign calcifications with upper outer quadrant of the LEFT
breast. Recommend follow-up LEFT breast diagnostic mammogram in 6
months to ensure stability.

RECOMMENDATION:
LEFT breast diagnostic mammogram, with magnification views, in 6
months.

I have discussed the findings and recommendations with the patient.
If applicable, a reminder letter will be sent to the patient
regarding the next appointment.

BI-RADS CATEGORY  3: Probably benign.

## 2021-04-21 ENCOUNTER — Encounter: Payer: Self-pay | Admitting: Registered Nurse

## 2021-04-26 ENCOUNTER — Other Ambulatory Visit: Payer: Self-pay | Admitting: Registered Nurse

## 2021-04-26 DIAGNOSIS — Z720 Tobacco use: Secondary | ICD-10-CM

## 2021-04-26 DIAGNOSIS — Z1211 Encounter for screening for malignant neoplasm of colon: Secondary | ICD-10-CM

## 2021-04-26 MED ORDER — BUPROPION HCL ER (SR) 150 MG PO TB12: 150.0000 mg | ORAL_TABLET | Freq: Two times a day (BID) | ORAL | 1 refills | Status: DC

## 2021-05-08 LAB — COLOGUARD: Cologuard: NEGATIVE

## 2021-05-17 ENCOUNTER — Encounter: Payer: Self-pay | Admitting: Registered Nurse

## 2021-05-17 ENCOUNTER — Other Ambulatory Visit: Payer: Self-pay

## 2021-05-17 DIAGNOSIS — Z720 Tobacco use: Secondary | ICD-10-CM

## 2021-05-17 MED ORDER — BUPROPION HCL ER (SR) 150 MG PO TB12
150.0000 mg | ORAL_TABLET | Freq: Two times a day (BID) | ORAL | 1 refills | Status: DC
Start: 2021-05-17 — End: 2021-10-05

## 2021-06-30 ENCOUNTER — Encounter: Payer: Self-pay | Admitting: Registered Nurse

## 2021-06-30 ENCOUNTER — Ambulatory Visit: Payer: Medicare Other

## 2021-07-10 ENCOUNTER — Encounter: Payer: Self-pay | Admitting: Registered Nurse

## 2021-07-26 ENCOUNTER — Encounter: Payer: Self-pay | Admitting: *Deleted

## 2021-07-29 NOTE — Telephone Encounter (Signed)
This concern has been previously addressed by myself and/or another provider.  If they patient has ongoing concerns, they can contact me at their convenience.  Thank you,  Rich Yoon Barca, NP 

## 2021-08-02 ENCOUNTER — Other Ambulatory Visit: Payer: Self-pay | Admitting: Registered Nurse

## 2021-08-02 DIAGNOSIS — E7800 Pure hypercholesterolemia, unspecified: Secondary | ICD-10-CM

## 2021-08-06 ENCOUNTER — Ambulatory Visit (INDEPENDENT_AMBULATORY_CARE_PROVIDER_SITE_OTHER): Payer: Medicare Other | Admitting: *Deleted

## 2021-08-06 DIAGNOSIS — Z0000 Encounter for general adult medical examination without abnormal findings: Secondary | ICD-10-CM

## 2021-08-06 NOTE — Progress Notes (Signed)
Subjective:   Jennifer Marquez is a 70 y.o. female who presents for Medicare Annual (Subsequent) preventive examination.  I connected with  Jennifer Marquez on 08/06/21 by a telephone enabled telemedicine application and verified that I am speaking with the correct person using two identifiers.   I discussed the limitations of evaluation and management by telemedicine. The patient expressed understanding and agreed to proceed.   Review of Systems     Cardiac Risk Factors include: advanced age (>33men, >45 women);hypertension;smoking/ tobacco exposure     Objective:    Today's Vitals   There is no height or weight on file to calculate BMI.  Advanced Directives 08/06/2021 01/31/2020 01/22/2019  Does Patient Have a Medical Advance Directive? No No Yes  Does patient want to make changes to medical advance directive? - - No - Patient declined  Would patient like information on creating a medical advance directive? No - Patient declined No - Patient declined -    Current Medications (verified) Outpatient Encounter Medications as of 08/06/2021  Medication Sig   atorvastatin (LIPITOR) 40 MG tablet TAKE 1 TABLET BY MOUTH  DAILY   buPROPion (WELLBUTRIN SR) 150 MG 12 hr tablet Take 1 tablet (150 mg total) by mouth 2 (two) times daily.   gabapentin (NEURONTIN) 300 MG capsule TAKE 1 TO 2 CAPSULES BY  MOUTH AT BEDTIME   No facility-administered encounter medications on file as of 08/06/2021.    Allergies (verified) Patient has no known allergies.   History: Past Medical History:  Diagnosis Date   Hyperlipidemia    Prediabetes    Past Surgical History:  Procedure Laterality Date   BREAST BIOPSY Right 2016   benign   TUBAL LIGATION     Family History  Problem Relation Age of Onset   Hypertension Mother    Stroke Mother 71       TIA/CVA   Cancer Father 7       esophageal cancer   Cancer Brother 42       colon cancer   Heart disease Brother    COPD Brother    Social  History   Socioeconomic History   Marital status: Married    Spouse name: Not on file   Number of children: Not on file   Years of education: Not on file   Highest education level: Not on file  Occupational History   Occupation: retired  Tobacco Use   Smoking status: Every Day    Packs/day: 0.50    Years: 20.00    Pack years: 10.00    Types: Cigarettes   Smokeless tobacco: Never   Tobacco comments:    would like to discuss  Vaping Use   Vaping Use: Never used  Substance and Sexual Activity   Alcohol use: No   Drug use: No   Sexual activity: Not Currently    Birth control/protection: Surgical, Post-menopausal  Other Topics Concern   Not on file  Social History Narrative   Marital status: married x 30 years     Children: 2 sons; 8 grandchildren; 2 gg.      Lives: with husband      Employment: Engineer, maintenance work. Loves work; Public librarian.      Education:  High School      Tobacco: 8 cigarettes per day x 20 years      Alcohol: none      Drugs: none      Exercise: none in 2019     Seatbelt:  100%; no texting.   Social Determinants of Health   Financial Resource Strain: Low Risk    Difficulty of Paying Living Expenses: Not hard at all  Food Insecurity: No Food Insecurity   Worried About Charity fundraiser in the Last Year: Never true   [place] in the Last Year: Never true  Transportation Needs: No Transportation Needs   Lack of Transportation (Medical): No   Lack of Transportation (Non-Medical): No  Physical Activity: Insufficiently Active   Days of Exercise per Week: 3 days   Minutes of Exercise per Session: 20 min  Stress: No Stress Concern Present   Feeling of Stress : Not at all  Social Connections: Moderately Integrated   Frequency of Communication with Friends and Family: More than three times a week   Frequency of Social Gatherings with Friends and Family: Once a week   Attends Religious Services: More than 4 times per year   Active  Member of [employer] or Organizations: No   Attends Music therapist: Never   Marital Status: Married    Tobacco Counseling Ready to quit: Not Answered Counseling given: Not Answered Tobacco comments: would like to discuss   Clinical Intake:  Pre-visit preparation completed: Yes  Pain : No/denies pain     Nutritional Risks: None Diabetes: No  How often do you need to have someone help you when you read instructions, pamphlets, or other written materials from your doctor or pharmacy?: 1 - Never  Diabetic?   no  Interpreter Needed?: No  Information entered by :: Leroy Kennedy LPN   Activities of Daily Living In your present state of health, do you have any difficulty performing the following activities: 08/06/2021 03/21/2021  Hearing? N N  Vision? N N  Difficulty concentrating or making decisions? N N  Walking or climbing stairs? N N  Dressing or bathing? N N  Doing errands, shopping? N N  Preparing Food and eating ? N -  Using the Toilet? N -  In the past six months, have you accidently leaked urine? N -  Do you have problems with loss of bowel control? N -  Managing your Medications? N -  Managing your Finances? N -  Housekeeping or managing your Housekeeping? N -  Some recent data might be hidden    Patient Care Team: Maximiano Coss, NP as PCP - General (Adult Health Nurse Practitioner)  Indicate any recent Medical Services you may have received from other than [facility] providers in the past year (date may be approximate).     Assessment:   This is a routine wellness examination for [facility].  Hearing/Vision screen Hearing Screening - Comments:: No trouble hearing Vision Screening - Comments:: Up to date Dr. Katy Fitch  Dietary issues and exercise activities discussed: Current Exercise Habits: Home exercise routine, Type of exercise: walking, Time (Minutes): 20, Frequency (Times/Week): 3, Weekly Exercise (Minutes/Week): 60, Intensity: Mild   Goals  Addressed             This Visit's Progress    Quit Smoking   Not on track    Quit Smoking         Depression Screen PHQ 2/9 Scores 08/06/2021 03/21/2021 01/31/2020 03/22/2019 01/22/2019 05/09/2018 01/27/2018  PHQ - 2 Score 0 0 0 0 2 0 0  PHQ- 9 Score - - - - 3 - -    Fall Risk Fall Risk  08/06/2021 03/21/2021 01/31/2020 03/22/2019 01/22/2019  Falls in the past year? 0 0 0  0 0  Number falls in past yr: 0 0 0 - 0  Injury with Fall? 0 0 0 - 0  Follow up Falls evaluation completed;Falls prevention discussed Falls evaluation completed Falls evaluation completed;Education provided - -    FALL RISK PREVENTION PERTAINING TO THE HOME:  Any stairs in or around the home? No  If so, are there any without handrails? No  Home free of loose throw rugs in walkways, pet beds, electrical cords, etc? Yes  Adequate lighting in your home to reduce risk of falls? Yes   ASSISTIVE DEVICES UTILIZED TO PREVENT FALLS:  Life alert? No  Use of a cane, walker or w/c? No  Grab bars in the bathroom? No  Shower chair or bench in shower? No  Elevated toilet seat or a handicapped toilet? No   TIMED UP AND GO:  Was the test performed? No .    Cognitive Function:  Normal cognitive status assessed by direct observation by this Nurse Health Advisor. No abnormalities found.     MMSE - Mini Mental State Exam 01/22/2019  Not completed: Unable to complete;Refused     6CIT Screen 01/31/2020 01/22/2019  What Year? 0 points 0 points  What month? 0 points 0 points  What time? 0 points 0 points  Count back from 20 0 points 0 points  Months in reverse 0 points 0 points  Repeat phrase 0 points 0 points  Total Score 0 0    Immunizations Immunization History  Administered Date(s) Administered   Fluad Quad(high Dose 65+) 06/29/2019, 06/29/2020, 07/10/2021   Influenza Split 07/07/2013, 07/07/2014   Influenza, High Dose Seasonal PF 07/14/2015, 07/24/2016, 07/25/2017, 07/22/2018   PFIZER(Purple Top)SARS-COV-2  Vaccination 04/03/2020   Pneumococcal Conjugate-13 12/28/2015   Pneumococcal Polysaccharide-23 08/15/2009, 01/01/2017   Tdap 09/01/2012   Zoster Recombinat (Shingrix) 01/07/2017, 09/17/2017   Zoster, Live 11/07/2012    TDAP status: Up to date  Flu Vaccine status: Up to date  Pneumococcal vaccine status: Up to date  Covid-19 vaccine status: Information provided on how to obtain vaccines.   Qualifies for Shingles Vaccine? No   Zostavax completed Yes   Shingrix Completed?: Yes  Screening Tests Health Maintenance  Topic Date Due   COVID-19 Vaccine (2 - Pfizer series) 04/24/2020   TETANUS/TDAP  09/01/2022   MAMMOGRAM  02/03/2023   Fecal DNA (Cologuard)  05/02/2024   Pneumonia Vaccine 16+ Years old  Completed   INFLUENZA VACCINE  Completed   DEXA SCAN  Completed   Hepatitis C Screening  Completed   Zoster Vaccines- Shingrix  Completed   HPV VACCINES  Aged Out    Health Maintenance  Health Maintenance Due  Topic Date Due   COVID-19 Vaccine (2 - Pfizer series) 04/24/2020    Colorectal cancer screening: Type of screening: Cologuard. Completed  . Repeat every 3 years  Mammogram status: Completed  . Repeat every year  Bone Density  Postponed until 2023  Lung Cancer Screening: (Low Dose CT Chest recommended if Age 97-80 years, 30 pack-year currently smoking OR have quit w/in 15years.) does not qualify.   Lung Cancer Screening Referral:   Additional Screening:  Hepatitis C Screening: does not qualify; Completed   Vision Screening: Recommended annual ophthalmology exams for early detection of glaucoma and other disorders of the eye. Is the patient up to date with their annual eye exam?  Yes  Who is the provider or what is the name of the office in which the patient attends annual eye exams? Dr. Katy Fitch If pt  is not established with a provider, would they like to be referred to a provider to establish care? No .   Dental Screening: Recommended annual dental exams for  proper oral hygiene  Community Resource Referral / Chronic Care Management: CRR required this visit?  No   CCM required this visit?  No      Plan:     I have personally reviewed and noted the following in the patient's chart:   Medical and social history Use of alcohol, tobacco or illicit drugs  Current medications and supplements including opioid prescriptions.  Functional ability and status Nutritional status Physical activity Advanced directives List of other physicians Hospitalizations, surgeries, and ER visits in previous 12 months Vitals Screenings to include cognitive, depression, and falls Referrals and appointments  In addition, I have reviewed and discussed with patient certain preventive protocols, quality metrics, and best practice recommendations. A written personalized care plan for preventive services as well as general preventive health recommendations were provided to patient.     Leroy Kennedy, LPN   [id number]   Nurse Notes:

## 2021-08-06 NOTE — Patient Instructions (Signed)
Ms. Jennifer Marquez , Thank you for taking time to come for your Medicare Wellness Visit. I appreciate your ongoing commitment to your health goals. Please review the following plan we discussed and let me know if I can assist you in the future.   Screening recommendations/referrals: Colonoscopy: up to date Mammogram: up to date Bone Density: Education provided Recommended yearly ophthalmology/optometry visit for glaucoma screening and checkup Recommended yearly dental visit for hygiene and checkup  Vaccinations: Influenza vaccine: up to date Pneumococcal vaccine: up to date Tdap vaccine: up to date Shingles vaccine: Education provided    Advanced directives: Education provided   Conditions/risks identified:   Next appointment: 09-18-2021 @ 9:30 Maximiano Coss NP -  Preventive Care 71 Years and Older, Female Preventive care refers to lifestyle choices and visits with your health care provider that can promote health and wellness. What does preventive care include? A yearly physical exam. This is also called an annual well check. Dental exams once or twice a year. Routine eye exams. Ask your health care provider how often you should have your eyes checked. Personal lifestyle choices, including: Daily care of your teeth and gums. Regular physical activity. Eating a healthy diet. Avoiding tobacco and drug use. -Limiting alcohol use. Practicing safe sex. Taking low-dose aspirin every day. Taking vitamin and mineral supplements as recommended by your health care provider. What happens during an annual well check? The services and screenings done by your health care provider during your annual well check will depend on your age, overall health, lifestyle risk factors, and family history of disease. Counseling  Your health care provider may ask you questions about your: Alcohol use. Tobacco use. Drug use. Emotional well-being. Home and relationship well-being. Sexual activity. Eating  habits. History of falls. Memory and ability to understand (cognition). Work and work Statistician. Reproductive health. Screening  You may have the following tests or measurements: Height, weight, and BMI. Blood pressure. Lipid and cholesterol levels. These may be checked every 5 years, or more frequently if you are over 46 years old. Skin check. Lung cancer screening. You may have this screening every year starting at age 16 if you have a 30-pack-year history of smoking and currently smoke or have quit within the past 15 years. Fecal occult blood test (FOBT) of the stool. You may have this test every year starting at age 35. Flexible sigmoidoscopy or colonoscopy. You may have a sigmoidoscopy every 5 years or a colonoscopy every 10 years starting at age 67. Hepatitis C blood test. Hepatitis B blood test. Sexually transmitted disease (STD) testing. Diabetes screening. This is done by checking your blood sugar (glucose) after you have not eaten for a while (fasting). You may have this done every 1-3 years. Bone density scan. This is done to screen for osteoporosis. You may have this done starting at age 65. Mammogram. This may be done every 1-2 years. Talk to your health care provider about how often you should have regular mammograms. Talk with your health care provider about your test results, treatment options, and if necessary, the need for more tests. Vaccines  Your health care provider may recommend certain vaccines, such as: Influenza vaccine. This is recommended every year. Tetanus, diphtheria, and acellular pertussis (Tdap, Td) vaccine. You may need a Td booster every 10 years. Zoster vaccine. You may need this after age 63. Pneumococcal 13-valent conjugate (PCV13) vaccine. One dose is recommended after age 81. Pneumococcal polysaccharide (PPSV23) vaccine. One dose is recommended after age 61. Talk to your health  care provider about which screenings and vaccines you need and how  often you need them. This information is not intended to replace advice given to you by your health care provider. Make sure you discuss any questions you have with your health care provider. Document Released: 10/20/2015 Document Revised: 06/12/2016 Document Reviewed: 07/25/2015 Elsevier Interactive Patient Education  2017 [place] Prevention in the Home Falls can cause injuries. They can happen to people of all ages. There are many things you can do to make your home safe and to help prevent falls. What can I do on the outside of my home? Regularly fix the edges of walkways and driveways and fix any cracks. Remove anything that might make you trip as you walk through a door, such as a raised step or threshold. Trim any bushes or trees on the path to your home. Use bright outdoor lighting. Clear any walking paths of anything that might make someone trip, such as rocks or tools. Regularly check to see if handrails are loose or broken. Make sure that both sides of any steps have handrails. Any raised decks and porches should have guardrails on the edges. Have any leaves, snow, or ice cleared regularly. Use sand or salt on walking paths during winter. Clean up any spills in your garage right away. This includes oil or grease spills. What can I do in the bathroom? Use night lights. Install grab bars by the toilet and in the tub and shower. Do not use towel bars as grab bars. Use non-skid mats or decals in the tub or shower. If you need to sit down in the shower, use a plastic, non-slip stool. Keep the floor dry. Clean up any water that spills on the floor as soon as it happens. Remove soap buildup in the tub or shower regularly. Attach bath mats securely with double-sided non-slip rug tape. Do not have throw rugs and other things on the floor that can make you trip. What can I do in the bedroom? Use night lights. Make sure that you have a light by your bed that is easy to  reach. Do not use any sheets or blankets that are too big for your bed. They should not hang down onto the floor. Have a firm chair that has side arms. You can use this for support while you get dressed. Do not have throw rugs and other things on the floor that can make you trip. What can I do in the kitchen? Clean up any spills right away. Avoid walking on wet floors. Keep items that you use a lot in easy-to-reach places. If you need to reach something above you, use a strong step stool that has a grab bar. Keep electrical cords out of the way. Do not use floor polish or wax that makes floors slippery. If you must use wax, use non-skid floor wax. Do not have throw rugs and other things on the floor that can make you trip. What can I do with my stairs? Do not leave any items on the stairs. Make sure that there are handrails on both sides of the stairs and use them. Fix handrails that are broken or loose. Make sure that handrails are as long as the stairways. Check any carpeting to make sure that it is firmly attached to the stairs. Fix any carpet that is loose or worn. Avoid having throw rugs at the top or bottom of the stairs. If you do have throw rugs, attach them to  the floor with carpet tape. Make sure that you have a light switch at the top of the stairs and the bottom of the stairs. If you do not have them, ask someone to add them for you. What else can I do to help prevent falls? Wear shoes that: Do not have high heels. Have rubber bottoms. Are comfortable and fit you well. Are closed at the toe. Do not wear sandals. If you use a stepladder: Make sure that it is fully opened. Do not climb a closed stepladder. Make sure that both sides of the stepladder are locked into place. Ask someone to hold it for you, if possible. Clearly mark and make sure that you can see: Any grab bars or handrails. First and last steps. Where the edge of each step is. Use tools that help you move  around (mobility aids) if they are needed. These include: Canes. Walkers. Scooters. Crutches. Turn on the lights when you go into a dark area. Replace any light bulbs as soon as they burn out. Set up your furniture so you have a clear path. Avoid moving your furniture around. If any of your floors are uneven, fix them. If there are any pets around you, be aware of where they are. Review your medicines with your doctor. Some medicines can make you feel dizzy. This can increase your chance of falling. Ask your doctor what other things that you can do to help prevent falls. This information is not intended to replace advice given to you by your health care provider. Make sure you discuss any questions you have with your health care provider. Document Released: 07/20/2009 Document Revised: 02/29/2016 Document Reviewed: 10/28/2014 Elsevier Interactive Patient Education  2017 [employer].

## 2021-08-09 ENCOUNTER — Ambulatory Visit: Payer: Medicare Other

## 2021-09-18 ENCOUNTER — Ambulatory Visit: Payer: Medicare Other | Admitting: Registered Nurse

## 2021-10-04 ENCOUNTER — Other Ambulatory Visit: Payer: Self-pay | Admitting: Registered Nurse

## 2021-10-04 DIAGNOSIS — Z720 Tobacco use: Secondary | ICD-10-CM

## 2022-01-11 ENCOUNTER — Encounter: Payer: Self-pay | Admitting: Registered Nurse

## 2022-01-25 ENCOUNTER — Other Ambulatory Visit: Payer: Self-pay | Admitting: Registered Nurse

## 2022-01-25 DIAGNOSIS — R921 Mammographic calcification found on diagnostic imaging of breast: Secondary | ICD-10-CM

## 2022-01-25 DIAGNOSIS — Z09 Encounter for follow-up examination after completed treatment for conditions other than malignant neoplasm: Secondary | ICD-10-CM

## 2022-02-05 DIAGNOSIS — H2513 Age-related nuclear cataract, bilateral: Secondary | ICD-10-CM | POA: Diagnosis not present

## 2022-02-07 ENCOUNTER — Ambulatory Visit
Admission: RE | Admit: 2022-02-07 | Discharge: 2022-02-07 | Disposition: A | Discharge status: Home / Self Care | Payer: Medicare Other | Source: Ambulatory Visit | Attending: Registered Nurse | Admitting: Registered Nurse

## 2022-02-07 DIAGNOSIS — R921 Mammographic calcification found on diagnostic imaging of breast: Secondary | ICD-10-CM | POA: Diagnosis not present

## 2022-02-07 DIAGNOSIS — Z09 Encounter for follow-up examination after completed treatment for conditions other than malignant neoplasm: Secondary | ICD-10-CM

## 2022-02-22 ENCOUNTER — Other Ambulatory Visit: Payer: Self-pay | Admitting: Registered Nurse

## 2022-02-22 DIAGNOSIS — N951 Menopausal and female climacteric states: Secondary | ICD-10-CM

## 2022-02-25 NOTE — Telephone Encounter (Signed)
Courtesy refill  

## 2022-02-26 ENCOUNTER — Telehealth: Payer: Self-pay

## 2022-02-26 NOTE — Telephone Encounter (Signed)
Sent referral documentation to scan.

## 2022-04-21 ENCOUNTER — Other Ambulatory Visit: Payer: Self-pay | Admitting: Registered Nurse

## 2022-04-21 DIAGNOSIS — E7800 Pure hypercholesterolemia, unspecified: Secondary | ICD-10-CM

## 2022-04-30 ENCOUNTER — Encounter: Payer: Self-pay | Admitting: Registered Nurse

## 2022-04-30 ENCOUNTER — Ambulatory Visit (INDEPENDENT_AMBULATORY_CARE_PROVIDER_SITE_OTHER): Payer: Medicare Other | Admitting: Registered Nurse

## 2022-04-30 VITALS — BP 132/66 | HR 60 | Temp 97.6°F | Resp 18 | Ht 62.0 in | Wt 150.8 lb

## 2022-04-30 DIAGNOSIS — Z130 Encounter for screening for diseases of the blood and blood-forming organs and certain disorders involving the immune mechanism: Secondary | ICD-10-CM

## 2022-04-30 DIAGNOSIS — Z1329 Encounter for screening for other suspected endocrine disorder: Secondary | ICD-10-CM | POA: Diagnosis not present

## 2022-04-30 DIAGNOSIS — Z13228 Encounter for screening for other metabolic disorders: Secondary | ICD-10-CM

## 2022-04-30 DIAGNOSIS — Z720 Tobacco use: Secondary | ICD-10-CM

## 2022-04-30 DIAGNOSIS — Z0000 Encounter for general adult medical examination without abnormal findings: Secondary | ICD-10-CM

## 2022-04-30 DIAGNOSIS — Z13220 Encounter for screening for lipoid disorders: Secondary | ICD-10-CM

## 2022-04-30 LAB — COMPREHENSIVE METABOLIC PANEL
ALT: 14 U/L (ref 0–35)
AST: 16 U/L (ref 0–37)
Albumin: 4.4 g/dL (ref 3.5–5.2)
Alkaline Phosphatase: 89 U/L (ref 39–117)
BUN: 7 mg/dL (ref 6–23)
CO2: 30 mEq/L (ref 19–32)
Calcium: 9.8 mg/dL (ref 8.4–10.5)
Chloride: 106 mEq/L (ref 96–112)
Creatinine, Ser: 0.86 mg/dL (ref 0.40–1.20)
GFR: 67.48 mL/min (ref >60.00)
Glucose, Bld: 99 mg/dL (ref 70–99)
Potassium: 4.2 mEq/L (ref 3.5–5.1)
Sodium: 144 mEq/L (ref 135–145)
Total Bilirubin: 0.6 mg/dL (ref 0.2–1.2)
Total Protein: 7 g/dL (ref 6.0–8.3)

## 2022-04-30 LAB — CBC WITH DIFFERENTIAL/PLATELET
Basophils Absolute: 0.1 10*3/uL (ref 0.0–0.1)
Basophils Relative: 1.2 % (ref 0.0–3.0)
Eosinophils Absolute: 0.2 10*3/uL (ref 0.0–0.7)
Eosinophils Relative: 2.5 % (ref 0.0–5.0)
HCT: 42.4 % (ref 36.0–46.0)
Hemoglobin: 14.1 g/dL (ref 12.0–15.0)
Lymphocytes Relative: 29.8 % (ref 12.0–46.0)
Lymphs Abs: 2.1 10*3/uL (ref 0.7–4.0)
MCHC: 33.4 g/dL (ref 30.0–36.0)
MCV: 91.9 fl (ref 78.0–100.0)
Monocytes Absolute: 0.5 10*3/uL (ref 0.1–1.0)
Monocytes Relative: 7.4 % (ref 3.0–12.0)
Neutro Abs: 4.2 10*3/uL (ref 1.4–7.7)
Neutrophils Relative %: 59.1 % (ref 43.0–77.0)
Platelets: 246 10*3/uL (ref 150.0–400.0)
RBC: 4.61 Mil/uL (ref 3.87–5.11)
RDW: 13.6 % (ref 11.5–15.5)
WBC: 7 10*3/uL (ref 4.0–10.5)

## 2022-04-30 LAB — TSH: TSH: 2.87 u[IU]/mL (ref 0.35–5.50)

## 2022-04-30 LAB — LIPID PANEL
Cholesterol: 171 mg/dL (ref 0–200)
HDL: 47.1 mg/dL (ref >39.00)
LDL Cholesterol: 97 mg/dL (ref 0–99)
NonHDL: 123.64
Total CHOL/HDL Ratio: 4
Triglycerides: 131 mg/dL (ref 0.0–149.0)
VLDL: 26.2 mg/dL (ref 0.0–40.0)

## 2022-04-30 LAB — HEMOGLOBIN A1C: Hgb A1c MFr Bld: 6 % (ref 4.6–6.5)

## 2022-04-30 MED ORDER — NICOTINE 7 MG/24HR TD PT24: 7.0000 mg | MEDICATED_PATCH | Freq: Every day | TRANSDERMAL | 0 refills | Status: DC

## 2022-04-30 MED ORDER — NICOTINE 21 MG/24HR TD PT24: 21.0000 mg | MEDICATED_PATCH | Freq: Every day | TRANSDERMAL | 0 refills | Status: DC

## 2022-04-30 MED ORDER — NICOTINE 14 MG/24HR TD PT24: 14.0000 mg | MEDICATED_PATCH | Freq: Every day | TRANSDERMAL | 0 refills | Status: DC

## 2022-04-30 NOTE — Progress Notes (Signed)
Complete physical exam  Patient: Jennifer Marquez   DOB: April 10, 1951   71 y.o. Female  MRN: [id number] Visit Date: 04/30/2022  Subjective:    Chief Complaint  Patient presents with   Annual Exam    Patient states she is here for a CPE    Jennifer Marquez is a 71 y.o. female who presents today for a complete physical exam. She reports consuming a general diet.     She generally feels well. She reports sleeping well. She does have additional problems to discuss today.   Tobacco Cessation Interested in quitting Wellbutrin gave nightmares, hot flashes Her sister had good success with nicotine patches, would like to try these. No new respiratory symptoms of concern.   Vision:Within the last year Dental:Within Last 6 months STD Screen:No PSA:No Most recent fall risk assessment:    04/30/2022    7:39 AM  [place] in the past year? 1  Number falls in past yr: 0  Injury with Fall? 0  Risk for fall due to : History of fall(s)  Follow up Falls evaluation completed     Most recent depression screenings:    04/30/2022    7:40 AM 08/06/2021   12:28 PM  PHQ 2/9 Scores  PHQ - 2 Score 0 0  PHQ- 9 Score 0      Patient Active Problem List   Diagnosis Date Noted   Family history of colon cancer 01/01/2017   Menopause 09/01/2012   Fibroids 09/01/2012   Tobacco abuse 09/01/2012   Hyperlipidemia 09/01/2012   Past Medical History:  Diagnosis Date   Hyperlipidemia    Prediabetes    Past Surgical History:  Procedure Laterality Date   BREAST BIOPSY Right 2016   benign   TUBAL LIGATION     Social History   Tobacco Use   Smoking status: Every Day    Packs/day: 0.50    Years: 20.00    Total pack years: 10.00    Types: Cigarettes   Smokeless tobacco: Never   Tobacco comments:    would like to discuss  Vaping Use   Vaping Use: Never used  Substance Use Topics   Alcohol use: No   Drug use: No   Social History   Socioeconomic History   Marital status:  Married    Spouse name: Not on file   Number of children: Not on file   Years of education: Not on file   Highest education level: Not on file  Occupational History   Occupation: retired  Tobacco Use   Smoking status: Every Day    Packs/day: 0.50    Years: 20.00    Total pack years: 10.00    Types: Cigarettes   Smokeless tobacco: Never   Tobacco comments:    would like to discuss  Vaping Use   Vaping Use: Never used  Substance and Sexual Activity   Alcohol use: No   Drug use: No   Sexual activity: Not Currently    Birth control/protection: Surgical, Post-menopausal  Other Topics Concern   Not on file  Social History Narrative   Marital status: married x 30 years     Children: 2 sons; 8 grandchildren; 2 gg.      Lives: with husband      Employment: Engineer, maintenance work. Loves work; Public librarian.      Education:  High School      Tobacco: 8 cigarettes per day x 20 years  Alcohol: none      Drugs: none      Exercise: none in 2019     Seatbelt:  100%; no texting.   Social Determinants of Health   Financial Resource Strain: Low Risk  (08/06/2021)   Overall Financial Resource Strain (CARDIA)    Difficulty of Paying Living Expenses: Not hard at all  Food Insecurity: No Food Insecurity (08/06/2021)   Hunger Vital Sign    Worried About Running Out of Food in the Last Year: Never true    Ran Out of Food in the Last Year: Never true  Transportation Needs: No Transportation Needs (08/06/2021)   PRAPARE - Hydrologist (Medical): No    Lack of Transportation (Non-Medical): No  Physical Activity: Insufficiently Active (08/06/2021)   Exercise Vital Sign    Days of Exercise per Week: 3 days    Minutes of Exercise per Session: 20 min  Stress: No Stress Concern Present (08/06/2021)   [place]    Feeling of Stress : Not at all  Social Connections: Moderately Integrated  (08/06/2021)   Social Connection and Isolation Panel [NHANES]    Frequency of Communication with Friends and Family: More than three times a week    Frequency of Social Gatherings with Friends and Family: Once a week    Attends Religious Services: More than 4 times per year    Active Member of [employer] or Organizations: No    Attends Archivist Meetings: Never    Marital Status: Married  Human resources officer Violence: Not At Risk (08/06/2021)   Humiliation, Afraid, Rape, and Kick questionnaire    Fear of Current or Ex-Partner: No    Emotionally Abused: No    Physically Abused: No    Sexually Abused: No   Family Status  Relation Name Status   Mother  Alive, age 26y   Father  Deceased at age 69       esophageal cancer; kidney failure.   Sister  Alive   Brother  Alive   Son  Alive   Brother  Deceased at age 57       COLON CANCER   Brother  Deceased at age 94       cardiac arrest   Sister  35   Son  60   Family History  Problem Relation Age of Onset   Hypertension Mother    Stroke Mother 51       TIA/CVA   Cancer Father 40       esophageal cancer   Cancer Brother 39       colon cancer   Heart disease Brother    COPD Brother    No Known Allergies   Patient Care Team: Maximiano Coss, NP as PCP - General (Adult Health Nurse Practitioner)   Medications: Outpatient Medications Prior to Visit  Medication Sig   atorvastatin (LIPITOR) 40 MG tablet TAKE 1 TABLET BY MOUTH DAILY   gabapentin (NEURONTIN) 300 MG capsule TAKE 1 TO 2 CAPSULES BY  MOUTH AT BEDTIME   [DISCONTINUED] buPROPion (WELLBUTRIN SR) 150 MG 12 hr tablet TAKE 1 TABLET BY MOUTH  TWICE DAILY   No facility-administered medications prior to visit.    Review of Systems  Constitutional: Negative.   HENT: Negative.    Eyes: Negative.   Respiratory: Negative.    Cardiovascular: Negative.   Gastrointestinal: Negative.   Genitourinary: Negative.   Musculoskeletal: Negative.   Skin: Negative.  Neurological: Negative.   Psychiatric/Behavioral: Negative.    All other systems reviewed and are negative.   Last CBC Lab Results  Component Value Date   WBC 7.5 03/21/2021   HGB 14.4 03/21/2021   HCT 42.5 03/21/2021   MCV 91.7 03/21/2021   MCH 30.8 03/22/2019   RDW 13.5 03/21/2021   PLT 252.0 09/73/5329   Last metabolic panel Lab Results  Component Value Date   GLUCOSE 89 03/21/2021   NA 140 03/21/2021   K 4.2 03/21/2021   CL 104 03/21/2021   CO2 28 03/21/2021   BUN 9 03/21/2021   CREATININE 0.80 03/21/2021   GFRNONAA 69 03/22/2020   CALCIUM 9.8 03/21/2021   PROT 7.0 03/21/2021   ALBUMIN 4.6 03/21/2021   LABGLOB 2.4 03/22/2020   AGRATIO 1.8 03/22/2020   BILITOT 0.7 03/21/2021   ALKPHOS 96 03/21/2021   AST 13 03/21/2021   ALT 15 03/21/2021   Last lipids Lab Results  Component Value Date   CHOL 157 03/21/2021   HDL 44.70 03/21/2021   LDLCALC 83 03/21/2021   TRIG 147.0 03/21/2021   CHOLHDL 4 03/21/2021   Last hemoglobin A1c Lab Results  Component Value Date   HGBA1C 6.0 03/21/2021   Last thyroid functions Lab Results  Component Value Date   TSH 1.90 03/21/2021   Last vitamin D Lab Results  Component Value Date   VD25OH 16 (L) 09/01/2012   Last vitamin B12 and Folate No results found for: "VITAMINB12", "FOLATE"      Objective:     BP 132/66   Pulse 60   Temp 97.6 F (36.4 C) (Temporal)   Resp 18   Ht '5\' 2"'$  (1.575 m)   Wt 150 lb 12.8 oz (68.4 kg)   SpO2 98%   BMI 27.58 kg/m   BP Readings from Last 3 Encounters:  04/30/22 132/66  03/21/21 (!) 119/57  01/31/20 136/70   Wt Readings from Last 3 Encounters:  04/30/22 150 lb 12.8 oz (68.4 kg)  03/21/21 152 lb 3.2 oz (69 kg)  01/31/20 160 lb (72.6 kg)   SpO2 Readings from Last 3 Encounters:  04/30/22 98%  03/21/21 99%  03/22/19 95%      Physical Exam Vitals and nursing note reviewed.  Constitutional:      General: She is not in acute distress.    Appearance: Normal  appearance. She is not ill-appearing, toxic-appearing or diaphoretic.  HENT:     Head: Normocephalic and atraumatic.     Right Ear: Tympanic membrane, ear canal and external ear normal. There is no impacted cerumen.     Left Ear: Tympanic membrane, ear canal and external ear normal. There is no impacted cerumen.     Nose: Nose normal. No congestion or rhinorrhea.     Mouth/Throat:     Mouth: Mucous membranes are moist.     Pharynx: Oropharynx is clear. No oropharyngeal exudate or posterior oropharyngeal erythema.  Eyes:     General: No scleral icterus.       Right eye: No discharge.        Left eye: No discharge.     Extraocular Movements: Extraocular movements intact.     Conjunctiva/sclera: Conjunctivae normal.     Pupils: Pupils are equal, round, and reactive to light.  Neck:     Vascular: No carotid bruit.  Cardiovascular:     Rate and Rhythm: Normal rate and regular rhythm.     Pulses: Normal pulses.     Heart sounds: Normal heart sounds.  No murmur heard.    No friction rub. No gallop.  Pulmonary:     Effort: Pulmonary effort is normal. No respiratory distress.     Breath sounds: Normal breath sounds. No stridor. No wheezing, rhonchi or rales.  Chest:     Chest wall: No tenderness.  Abdominal:     General: Abdomen is flat. Bowel sounds are normal. There is no distension.     Palpations: There is no mass.     Tenderness: There is no abdominal tenderness. There is no right CVA tenderness, left CVA tenderness, guarding or rebound.     Hernia: No hernia is present.  Musculoskeletal:        General: No swelling, tenderness, deformity or signs of injury. Normal range of motion.     Cervical back: Normal range of motion and neck supple. No rigidity or tenderness.     Right lower leg: No edema.     Left lower leg: No edema.  Lymphadenopathy:     Cervical: No cervical adenopathy.  Skin:    General: Skin is warm and dry.     Capillary Refill: Capillary refill takes less than 2  seconds.     Coloration: Skin is not jaundiced or pale.     Findings: No bruising, erythema, lesion or rash.  Neurological:     General: No focal deficit present.     Mental Status: She is alert and oriented to person, place, and time. Mental status is at baseline.     Cranial Nerves: No cranial nerve deficit.     Sensory: No sensory deficit.     Motor: No weakness.     Coordination: Coordination normal.     Gait: Gait normal.     Deep Tendon Reflexes: Reflexes normal.  Psychiatric:        Mood and Affect: Mood normal.        Behavior: Behavior normal.        Thought Content: Thought content normal.        Judgment: Judgment normal.      No results found for any visits on 04/30/22.    Assessment & Plan:    Routine Health Maintenance and Physical Exam  Immunization History  Administered Date(s) Administered   Fluad Quad(high Dose 65+) 06/29/2019, 06/29/2020, 07/10/2021   Influenza Split 07/07/2013, 07/07/2014   Influenza, High Dose Seasonal PF 07/14/2015, 07/24/2016, 07/25/2017, 07/22/2018   Influenza-Unspecified 08/05/2021   PFIZER(Purple Top)SARS-COV-2 Vaccination 04/03/2020   Pneumococcal Conjugate-13 12/28/2015   Pneumococcal Polysaccharide-23 08/15/2009, 01/01/2017   Tdap 09/01/2012   Zoster Recombinat (Shingrix) 01/07/2017, 09/17/2017   Zoster, Live 11/07/2012    Health Maintenance  Topic Date Due   COVID-19 Vaccine (2 - Pfizer series) 05/16/2022 (Originally 05/29/2020)   INFLUENZA VACCINE  05/07/2022   TETANUS/TDAP  09/01/2022   MAMMOGRAM  02/08/2024   Fecal DNA (Cologuard)  05/02/2024   Pneumonia Vaccine 22+ Years old  Completed   DEXA SCAN  Completed   Hepatitis C Screening  Completed   Zoster Vaccines- Shingrix  Completed   HPV VACCINES  Aged Out    Discussed health benefits of physical activity, and encouraged her to engage in regular exercise appropriate for her age and condition.  Problem List Items Addressed This Visit       Other   Tobacco  abuse    Nicotine patches given in 3 mo taper. Advised patient on quitting strategies.       Relevant Medications   nicotine (NICODERM CQ - DOSED IN MG/24  HOURS) 21 mg/24hr patch   nicotine (NICODERM CQ - DOSED IN MG/24 HOURS) 14 mg/24hr patch   nicotine (NICODERM CQ - DOSED IN MG/24 HR) 7 mg/24hr patch   Other Visit Diagnoses     Annual physical exam    -  Primary   Screening for endocrine, metabolic and immunity disorder       Relevant Orders   CBC with Differential/Platelet   Comprehensive metabolic panel   Hemoglobin A1c   TSH   Lipid screening       Relevant Orders   Lipid panel      Return in about 1 year (around 05/01/2023) for CPE and labs.     PLAN Exam unremarkable Labs collected. Will follow up with the patient as warranted. Patient encouraged to call clinic with any questions, comments, or concerns.   Maximiano Coss, NP

## 2022-04-30 NOTE — Assessment & Plan Note (Signed)
Nicotine patches given in 3 mo taper. Advised patient on quitting strategies.

## 2022-04-30 NOTE — Patient Instructions (Addendum)
Ms Nayda Riesen to see you!  I recommend: Inda Coke, [place] Dimas Chyle, MD Berniece Pap, MD Myrna Blazer Early, NP Jeralyn Ruths, DNP  I will call if labs are worrisome.  Stay well! Thank you for letting me take part in your care!  Rich    If you have lab work done today you will be contacted with your lab results within the next 2 weeks.  If you have not heard from [place] then please contact us. The fastest way to get your results is to register for My Chart.   IF you received an x-ray today, you will receive an invoice from [facility]. Please contact [facility] at [phone] with questions or concerns regarding your invoice.   IF you received labwork today, you will receive an invoice from [facility]. Please contact [facility] at [phone] with questions or concerns regarding your invoice.   Our billing staff will not be able to assist you with questions regarding bills from these companies.  You will be contacted with the lab results as soon as they are available. The fastest way to get your results is to activate your My Chart account. Instructions are located on the last page of this paperwork. If you have not heard from [place] regarding the results in 2 weeks, please contact this office.

## 2022-05-14 ENCOUNTER — Other Ambulatory Visit: Payer: Self-pay | Admitting: Registered Nurse

## 2022-05-14 DIAGNOSIS — N951 Menopausal and female climacteric states: Secondary | ICD-10-CM

## 2022-08-19 ENCOUNTER — Ambulatory Visit (INDEPENDENT_AMBULATORY_CARE_PROVIDER_SITE_OTHER): Payer: Medicare Other | Admitting: Internal Medicine

## 2022-08-19 ENCOUNTER — Encounter: Payer: Self-pay | Admitting: Internal Medicine

## 2022-08-19 VITALS — BP 118/80 | HR 80 | Temp 97.8°F | Ht 62.0 in | Wt 149.5 lb

## 2022-08-19 DIAGNOSIS — E7800 Pure hypercholesterolemia, unspecified: Secondary | ICD-10-CM | POA: Diagnosis not present

## 2022-08-19 DIAGNOSIS — Z0000 Encounter for general adult medical examination without abnormal findings: Secondary | ICD-10-CM

## 2022-08-19 DIAGNOSIS — Z720 Tobacco use: Secondary | ICD-10-CM

## 2022-08-19 MED ORDER — ATORVASTATIN CALCIUM 40 MG PO TABS: 40.0000 mg | ORAL_TABLET | Freq: Every day | ORAL | 3 refills | Status: DC

## 2022-08-19 NOTE — Assessment & Plan Note (Signed)
Flu shot up to date. Covid-19 counseled. Pneumonia complete. Shingrix complete. Tetanus due 2024 at pharmacy. Cologuard due 2025. Mammogram due 2024, pap smear aged out and dexa complete. Counseled about sun safety and mole surveillance. Counseled about the dangers of distracted driving. Given 10 year screening recommendations.

## 2022-08-19 NOTE — Progress Notes (Signed)
Subjective:   Patient ID: Jennifer Marquez, female    DOB: 09/12/1951, 71 y.o.   MRN: [id number]  HPI Here for medicare wellness, as well as transfer of care. Please see A/P for status and treatment of chronic medical problems.   Diet: heart healthy Physical activity: walks daily Depression/mood screen: negative Hearing: intact to whispered voice Visual acuity: grossly normal with lens, performs annual eye exam  ADLs: capable Fall risk: none Home safety: good Cognitive evaluation: intact to orientation, naming, recall and repetition EOL planning: adv directives discussed, has paperwork but not done  [employer] Visit from 08/19/2022 in [place] at [place] Visit from 08/19/2022 in [place] at [facility]  PHQ-9 Total Score 0         01/31/2020    9:06 AM 03/21/2021    8:52 AM 08/06/2021   12:04 PM 04/30/2022    7:39 AM 08/19/2022    1:03 PM  [place] in the past year? 0 0 0 1 0  Was there an injury with Fall? 0 0 0 0 0  Fall Risk Category Calculator 0 0 0 1 0  Fall Risk Category Low Low Low Low Low  Patient Fall Risk Level Low fall risk  Low fall risk Low fall risk   Patient at Risk for Falls Due to    History of fall(s)   Fall risk Follow up Falls evaluation completed;Education provided Falls evaluation completed Falls evaluation completed;Falls prevention discussed Falls evaluation completed Falls evaluation completed    I have personally reviewed and have noted 1. The patient's medical and social history - reviewed today no changes 2. Their use of alcohol, tobacco or illicit drugs 3. Their current medications and supplements 4. The patient's functional ability including ADL's, fall risks, home safety risks and hearing or visual impairment. 5. Diet and physical activities 6. Evidence for depression or mood disorders 7. Care team reviewed and updated 8.  The patient is not on  an opioid pain medication.  Patient Care Team: Maximiano Coss, NP as PCP - General (Adult Health Nurse Practitioner) Past Medical History:  Diagnosis Date   Hyperlipidemia    Prediabetes    Past Surgical History:  Procedure Laterality Date   BREAST BIOPSY Right 2016   benign   TUBAL LIGATION     Family History  Problem Relation Age of Onset   Hypertension Mother    Stroke Mother 51       TIA/CVA   Cancer Father 36       esophageal cancer   Cancer Brother 65       colon cancer   Heart disease Brother    COPD Brother    Review of Systems  Constitutional: Negative.   HENT: Negative.    Eyes: Negative.   Respiratory:  Negative for cough, chest tightness and shortness of breath.   Cardiovascular:  Negative for chest pain, palpitations and leg swelling.  Gastrointestinal:  Negative for abdominal distention, abdominal pain, constipation, diarrhea, nausea and vomiting.  Musculoskeletal: Negative.   Skin: Negative.   Neurological: Negative.   Psychiatric/Behavioral: Negative.      Objective:  Physical Exam Constitutional:      Appearance: She is well-developed.  HENT:     Head: Normocephalic and atraumatic.  Cardiovascular:     Rate and Rhythm: Normal rate and regular rhythm.  Pulmonary:     Effort: Pulmonary effort  is normal. No respiratory distress.     Breath sounds: Normal breath sounds. No wheezing or rales.  Abdominal:     General: Bowel sounds are normal. There is no distension.     Palpations: Abdomen is soft.     Tenderness: There is no abdominal tenderness. There is no rebound.  Musculoskeletal:     Cervical back: Normal range of motion.  Skin:    General: Skin is warm and dry.     Comments: Varicose vein right leg  Neurological:     Mental Status: She is alert and oriented to person, place, and time.     Coordination: Coordination normal.     Vitals:   08/19/22 1259  BP: 118/80  Pulse: 80  Temp: 97.8 F (36.6 C)  TempSrc: Oral  SpO2: 90%   Weight: 149 lb 8 oz (67.8 kg)  Height: '5\' 2"'$  (1.575 m)   Assessment & Plan:  Visit time 20 minutes in face to face communication with patient and coordination of care, additional 10 minutes spent in record review, coordination or care, ordering tests, communicating/referring to other healthcare professionals, documenting in medical records all on the same day of the visit for total time 30 minutes spent on the visit.This was separate from the AWV portion of the visit

## 2022-08-19 NOTE — Patient Instructions (Signed)
Try calling [phone] to see if they can ship you some nicotine patches.

## 2022-08-19 NOTE — Assessment & Plan Note (Signed)
Recent lipid panel at goal on lipitor 40 mg daily so will continue and refilled for 1 year.

## 2022-08-19 NOTE — Assessment & Plan Note (Signed)
Encouraged her to contact [facility] for nicotine patches. These are not covered through her insurance and she cannot afford to buy out of pocket. Encouraged to reduce or quit when able. Reminded about health problems from smoking.

## 2022-09-09 ENCOUNTER — Telehealth: Payer: Self-pay

## 2022-09-09 NOTE — Telephone Encounter (Signed)
She did not report taking this at recent new patient visit. Call patient and verify if she is taking medication and reason for taking then I will review

## 2022-09-09 NOTE — Telephone Encounter (Signed)
Spoke with the pt and was able to speak with her about the rx for "Bupropion Sr Tab 150 MG 12 hr, Take 1 Tablet by mouth twice daily" . Pt states she does not take the mentioned medication and has asked the pharmacy to stop asking for refills.  I was also to speak with Marzetta Board at [facility] and ask that they d/c the medication on the pts refill list as she is no longer taking this medication.

## 2022-09-27 ENCOUNTER — Encounter: Payer: Medicare Other | Admitting: Internal Medicine

## 2023-02-12 DIAGNOSIS — H2513 Age-related nuclear cataract, bilateral: Secondary | ICD-10-CM | POA: Diagnosis not present

## 2023-02-15 ENCOUNTER — Other Ambulatory Visit: Payer: Self-pay | Admitting: Family

## 2023-02-15 DIAGNOSIS — N951 Menopausal and female climacteric states: Secondary | ICD-10-CM

## 2023-02-17 ENCOUNTER — Encounter: Payer: Self-pay | Admitting: Internal Medicine

## 2023-02-17 ENCOUNTER — Ambulatory Visit (INDEPENDENT_AMBULATORY_CARE_PROVIDER_SITE_OTHER): Payer: Medicare Other | Admitting: Internal Medicine

## 2023-02-17 VITALS — BP 100/80 | HR 86 | Temp 98.5°F | Ht 62.0 in | Wt 150.0 lb

## 2023-02-17 DIAGNOSIS — Z720 Tobacco use: Secondary | ICD-10-CM

## 2023-02-17 DIAGNOSIS — E7800 Pure hypercholesterolemia, unspecified: Secondary | ICD-10-CM | POA: Diagnosis not present

## 2023-02-17 NOTE — Assessment & Plan Note (Signed)
Continue lipitor 40 mg daily and labs up to date due Nov 2024 at physical.

## 2023-02-17 NOTE — Progress Notes (Signed)
   Subjective:   Patient ID: Jennifer Marquez, female    DOB: April 26, 1951, 72 y.o.   MRN: [id number]  HPI The patient is a 72 YO female coming in for follow up.   Review of Systems  Constitutional: Negative.   HENT: Negative.    Eyes: Negative.   Respiratory:  Negative for cough, chest tightness and shortness of breath.   Cardiovascular:  Negative for chest pain, palpitations and leg swelling.  Gastrointestinal:  Negative for abdominal distention, abdominal pain, constipation, diarrhea, nausea and vomiting.  Musculoskeletal: Negative.   Skin: Negative.   Neurological: Negative.   Psychiatric/Behavioral: Negative.      Objective:  Physical Exam Constitutional:      Appearance: She is well-developed.  HENT:     Head: Normocephalic and atraumatic.  Cardiovascular:     Rate and Rhythm: Normal rate and regular rhythm.  Pulmonary:     Effort: Pulmonary effort is normal. No respiratory distress.     Breath sounds: Normal breath sounds. No wheezing or rales.  Abdominal:     General: Bowel sounds are normal. There is no distension.     Palpations: Abdomen is soft.     Tenderness: There is no abdominal tenderness. There is no rebound.  Musculoskeletal:     Cervical back: Normal range of motion.  Skin:    General: Skin is warm and dry.  Neurological:     Mental Status: She is alert and oriented to person, place, and time.     Coordination: Coordination normal.     Vitals:   02/17/23 1048  BP: 100/80  Pulse: 86  Temp: 98.5 F (36.9 C)  TempSrc: Oral  SpO2: 92%  Weight: 150 lb (68 kg)  Height: 5\' 2"  (1.575 m)    Assessment & Plan:  Visit time 15 minutes in face to face communication with patient and coordination of care, additional 5 minutes spent in record review, coordination or care, ordering tests, communicating/referring to other healthcare professionals, documenting in medical records all on the same day of the visit for total time 20 minutes spent on the visit.

## 2023-02-17 NOTE — Assessment & Plan Note (Addendum)
Did try nicotine patches and was able to reduce smoking. Counseled again today about need to quit. She is aware and unsure if now is a good time to attempt. Reminded about risk and harm.

## 2023-02-19 ENCOUNTER — Telehealth: Payer: Self-pay | Admitting: Internal Medicine

## 2023-02-19 ENCOUNTER — Other Ambulatory Visit: Payer: Self-pay

## 2023-02-19 DIAGNOSIS — N951 Menopausal and female climacteric states: Secondary | ICD-10-CM

## 2023-02-19 MED ORDER — GABAPENTIN 300 MG PO CAPS: ORAL_CAPSULE | ORAL | 3 refills | Status: DC

## 2023-02-19 NOTE — Telephone Encounter (Signed)
Prescription Request  02/19/2023  LOV: 02/17/2023  What is the name of the medication or equipment?  gabapentin (NEURONTIN) 300 MG capsule   Have you contacted your pharmacy to request a refill? Yes   Which pharmacy would you like this sent to?   [facility] - [place], [place] - [address] [place] [address] Phone: [phone] Fax: [phone]    Patient notified that their request is being sent to the clinical staff for review and that they should receive a response within 2 business days.   Please advise at Mobile [phone] (mobile)

## 2023-02-20 ENCOUNTER — Encounter: Payer: Self-pay | Admitting: Internal Medicine

## 2023-02-21 ENCOUNTER — Other Ambulatory Visit: Payer: Self-pay

## 2023-02-21 DIAGNOSIS — N951 Menopausal and female climacteric states: Secondary | ICD-10-CM

## 2023-02-21 MED ORDER — GABAPENTIN 300 MG PO CAPS: ORAL_CAPSULE | ORAL | 3 refills | Status: DC

## 2023-03-10 ENCOUNTER — Other Ambulatory Visit: Payer: Self-pay | Admitting: Internal Medicine

## 2023-03-10 DIAGNOSIS — Z1231 Encounter for screening mammogram for malignant neoplasm of breast: Secondary | ICD-10-CM

## 2023-03-13 ENCOUNTER — Ambulatory Visit
Admission: RE | Admit: 2023-03-13 | Discharge: 2023-03-13 | Disposition: A | Discharge status: Home / Self Care | Payer: Medicare Other | Source: Ambulatory Visit | Attending: Internal Medicine | Admitting: Internal Medicine

## 2023-03-13 DIAGNOSIS — Z1231 Encounter for screening mammogram for malignant neoplasm of breast: Secondary | ICD-10-CM

## 2023-08-01 ENCOUNTER — Other Ambulatory Visit: Payer: Self-pay | Admitting: Internal Medicine

## 2023-08-01 DIAGNOSIS — E7800 Pure hypercholesterolemia, unspecified: Secondary | ICD-10-CM

## 2023-08-13 ENCOUNTER — Ambulatory Visit: Payer: Medicare Other

## 2023-08-13 VITALS — Ht 62.0 in | Wt 148.0 lb

## 2023-08-13 DIAGNOSIS — Z0000 Encounter for general adult medical examination without abnormal findings: Secondary | ICD-10-CM | POA: Diagnosis not present

## 2023-08-13 DIAGNOSIS — Z13820 Encounter for screening for osteoporosis: Secondary | ICD-10-CM | POA: Diagnosis not present

## 2023-08-13 NOTE — Progress Notes (Cosign Needed Addendum)
Subjective:   Jennifer Marquez is a 72 y.o. female who presents for Medicare Annual (Subsequent) preventive examination.  Visit Complete: Virtual I connected with  Jennifer Marquez on 08/13/23 by a audio enabled telemedicine application and verified that I am speaking with the correct person using two identifiers.  Patient Location: Home  Provider Location: Office/Clinic  I discussed the limitations of evaluation and management by telemedicine. The patient expressed understanding and agreed to proceed.  Vital Signs: Because this visit was a virtual/telehealth visit, some criteria may be missing or patient reported. Any vitals not documented were not able to be obtained and vitals that have been documented are patient reported.  Patient Medicare AWV questionnaire was completed by the patient on 08/11/2023; I have confirmed that all information answered by patient is correct and no changes since this date.  Cardiac Risk Factors include: advanced age (>63men, >60 women);sedentary lifestyle;smoking/ tobacco exposure;family history of premature cardiovascular disease;dyslipidemia     Objective:    Today's Vitals   08/13/23 1605 08/13/23 1606  Weight: 148 lb (67.1 kg)   Height: 5\' 2"  (1.575 m)   PainSc: 0-No pain 0-No pain   Body mass index is 27.07 kg/m.     08/13/2023    4:09 PM 08/06/2021   12:04 PM 01/31/2020    9:04 AM 01/22/2019   11:22 AM  Advanced Directives  Does Patient Have a Medical Advance Directive? No No No Yes  Does patient want to make changes to medical advance directive?    No - Patient declined  Would patient like information on creating a medical advance directive? No - Patient declined No - Patient declined No - Patient declined     Current Medications (verified) Outpatient Encounter Medications as of 08/13/2023  Medication Sig   atorvastatin (LIPITOR) 40 MG tablet TAKE 1 TABLET BY MOUTH DAILY   gabapentin (NEURONTIN) 300 MG capsule TAKE 1 TO 2 CAPSULES BY  MOUTH AT BEDTIME   No facility-administered encounter medications on file as of 08/13/2023.    Allergies (verified) Patient has no known allergies.   History: Past Medical History:  Diagnosis Date   Hyperlipidemia    Prediabetes    Past Surgical History:  Procedure Laterality Date   BREAST BIOPSY Right 2016   benign   TUBAL LIGATION     Family History  Problem Relation Age of Onset   Hypertension Mother    Stroke Mother 65       TIA/CVA   Cancer Father 82       esophageal cancer   Cancer Brother 69       colon cancer   Heart disease Brother    COPD Brother    Social History   Socioeconomic History   Marital status: Married    Spouse name: Not on file   Number of children: Not on file   Years of education: Not on file   Highest education level: GED or equivalent  Occupational History   Occupation: retired  Tobacco Use   Smoking status: Every Day    Current packs/day: 0.50    Average packs/day: 0.5 packs/day for 20.0 years (10.0 ttl pk-yrs)    Types: Cigarettes   Smokeless tobacco: Never   Tobacco comments:    would like to discuss  Vaping Use   Vaping status: Never Used  Substance and Sexual Activity   Alcohol use: No   Drug use: No   Sexual activity: Not Currently    Birth control/protection: Surgical, Post-menopausal  Other Topics Concern   Not on file  Social History Narrative   Marital status: married x 30 years     Children: 2 sons; 8 grandchildren; 2 gg.      Lives: with husband      Employment: Proofreader work. Loves work; Barrister's clerk.      Education:  High School      Tobacco: 8 cigarettes per day x 20 years      Alcohol: none      Drugs: none      Exercise: none in 2019     Seatbelt:  100%; no texting.   Social Determinants of Health   Financial Resource Strain: Low Risk  (08/13/2023)   Overall Financial Resource Strain (CARDIA)    Difficulty of Paying Living Expenses: Not hard at all  Food Insecurity: No Food  Insecurity (08/13/2023)   Hunger Vital Sign    Worried About Running Out of Food in the Last Year: Never true    Ran Out of Food in the Last Year: Never true  Transportation Needs: No Transportation Needs (08/13/2023)   PRAPARE - Administrator, Civil Service (Medical): No    Lack of Transportation (Non-Medical): No  Physical Activity: Insufficiently Active (08/13/2023)   Exercise Vital Sign    Days of Exercise per Week: 1 day    Minutes of Exercise per Session: 20 min  Stress: No Stress Concern Present (08/13/2023)   [employer] of Occupational Health - Occupational Stress Questionnaire    Feeling of Stress : Not at all  Social Connections: Moderately Integrated (08/13/2023)   Social Connection and Isolation Panel [NHANES]    Frequency of Communication with Friends and Family: Three times a week    Frequency of Social Gatherings with Friends and Family: Once a week    Attends Religious Services: More than 4 times per year    Active Member of [employer] or Organizations: No    Attends Engineer, structural: Never    Marital Status: Married    Tobacco Counseling Ready to quit: Not Answered Counseling given: Not Answered Tobacco comments: would like to discuss   Clinical Intake:  Pre-visit preparation completed: Yes  Pain : No/denies pain Pain Score: 0-No pain     BMI - recorded: 27.07 Nutritional Status: BMI of 19-24  Normal Nutritional Risks: None Diabetes: No  How often do you need to have someone help you when you read instructions, pamphlets, or other written materials from your doctor or pharmacy?: 1 - Never What is the last grade level you completed in school?: HSG  Interpreter Needed?: No  Information entered by :: Kamon Fahr N. Oyinkansola Truax, LPN.   Activities of Daily Living    08/13/2023    4:11 PM 08/11/2023   12:16 PM  In your present state of health, do you have any difficulty performing the following activities:  Hearing? 0 0  Vision? 0 0   Difficulty concentrating or making decisions? 0 0  Walking or climbing stairs? 0 0  Dressing or bathing? 0 0  Doing errands, shopping? 0 0  Preparing Food and eating ? N N  Using the Toilet? N N  In the past six months, have you accidently leaked urine? N N  Do you have problems with loss of bowel control? N N  Managing your Medications? N N  Managing your Finances? N N  Housekeeping or managing your Housekeeping? N N    Patient Care Team: Myrlene Broker, MD as PCP -  General (Internal Medicine)  Indicate any recent Medical Services you may have received from other than [facility] providers in the past year (date may be approximate).     Assessment:   This is a routine wellness examination for [facility].  Hearing/Vision screen Hearing Screening - Comments:: Hearing: Patient denied any hearing difficulty.   No hearing aids.  Vision Screening - Comments:: Vision: Patient does wear corrective lenses/contacts/readers.  Annual eye exam done by: Ernesto Rutherford, MD.    Goals Addressed               This Visit's Progress     Client understands the importance of follow-up with providers by attending scheduled visits. (pt-stated)        Patient stated no other goals at this time.      Depression Screen    08/13/2023    4:08 PM 02/17/2023   10:50 AM 08/19/2022    1:03 PM 04/30/2022    7:40 AM 08/06/2021   12:28 PM 03/21/2021    8:52 AM 01/31/2020    9:06 AM  PHQ 2/9 Scores  PHQ - 2 Score 0 0 0 0 0 0 0  PHQ- 9 Score 0 0 0 0       Fall Risk    08/13/2023    4:10 PM 08/11/2023   12:16 PM 02/17/2023   10:50 AM 08/19/2022    1:03 PM 04/30/2022    7:39 AM  Fall Risk   Falls in the past year? 0 0 0 0 1  Number falls in past yr: 0 0 0 0 0  Injury with Fall? 0 0 0 0 0  Risk for fall due to : No Fall Risks    History of fall(s)  Follow up Falls prevention discussed  Falls evaluation completed Falls evaluation completed Falls evaluation completed    MEDICARE RISK AT  HOME: Medicare Risk at Home Any stairs in or around the home?: Yes If so, are there any without handrails?: Yes Home free of loose throw rugs in walkways, pet beds, electrical cords, etc?: Yes Adequate lighting in your home to reduce risk of falls?: Yes Life alert?: No Use of a cane, walker or w/c?: No Grab bars in the bathroom?: No Shower chair or bench in shower?: No Elevated toilet seat or a handicapped toilet?: No  TIMED UP AND GO:  Was the test performed?  No    Cognitive Function:    08/13/2023    4:11 PM 01/22/2019   11:31 AM  MMSE - Mini Mental State Exam  Not completed: Unable to complete Unable to complete;Refused        08/13/2023    4:12 PM 01/31/2020    9:04 AM 01/22/2019   11:23 AM  6CIT Screen  What Year? 0 points 0 points 0 points  What month? 0 points 0 points 0 points  What time? 0 points 0 points 0 points  Count back from 20 0 points 0 points 0 points  Months in reverse 0 points 0 points 0 points  Repeat phrase 0 points 0 points 0 points  Total Score 0 points 0 points 0 points    Immunizations Immunization History  Administered Date(s) Administered   Fluad Quad(high Dose 65+) 06/29/2019, 06/29/2020, 07/10/2021   Influenza Split 07/07/2013, 07/07/2014   Influenza, High Dose Seasonal PF 07/14/2015, 07/24/2016, 07/25/2017, 07/22/2018   Influenza-Unspecified 08/05/2021, 07/07/2022, 06/25/2023   PFIZER(Purple Top)SARS-COV-2 Vaccination 04/03/2020, 04/24/2020   PNEUMOCOCCAL CONJUGATE-20 06/25/2023   Pneumococcal Conjugate-13 12/28/2015   Pneumococcal  Polysaccharide-23 08/15/2009, 01/01/2017   Tdap 09/01/2012   Zoster Recombinant(Shingrix) 01/07/2017, 06/06/2017   Zoster, Live 11/07/2012    TDAP status: Due, Education has been provided regarding the importance of this vaccine. Advised may receive this vaccine at local pharmacy or Health Dept. Aware to provide a copy of the vaccination record if obtained from local pharmacy or Health Dept. Verbalized  acceptance and understanding.  Flu Vaccine status: Up to date  Pneumococcal vaccine status: Up to date  Covid-19 vaccine status: Completed vaccines  Qualifies for Shingles Vaccine? Yes   Zostavax completed Yes   Shingrix Completed?: Yes  Screening Tests Health Maintenance  Topic Date Due   DTaP/Tdap/Td (2 - Td or Tdap) 09/01/2022   COVID-19 Vaccine (3 - 2023-24 season) 06/08/2023   Fecal DNA (Cologuard)  05/02/2024   Medicare Annual Wellness (AWV)  08/12/2024   MAMMOGRAM  03/12/2025   Pneumonia Vaccine 66+ Years old  Completed   INFLUENZA VACCINE  Completed   DEXA SCAN  Completed   Hepatitis C Screening  Completed   Zoster Vaccines- Shingrix  Completed   HPV VACCINES  Aged Out    Health Maintenance  Health Maintenance Due  Topic Date Due   DTaP/Tdap/Td (2 - Td or Tdap) 09/01/2022   COVID-19 Vaccine (3 - 2023-24 season) 06/08/2023    Colorectal cancer screening: Type of screening: Cologuard. Completed 05/08/2021. Repeat every 3 years  Mammogram status: Completed 03/13/2023. Repeat every year  Bone Density status: Ordered 08/13/2023. Pt provided with contact info and advised to call to schedule appt.  Lung Cancer Screening: (Low Dose CT Chest recommended if Age 57-80 years, 20 pack-year currently smoking OR have quit w/in 15years.) does not qualify.   Lung Cancer Screening Referral: NO  Additional Screening:  Hepatitis C Screening: does qualify; Completed 01/01/2017  Vision Screening: Recommended annual ophthalmology exams for early detection of glaucoma and other disorders of the eye. Is the patient up to date with their annual eye exam?  Yes  Who is the provider or what is the name of the office in which the patient attends annual eye exams? Ernesto Rutherford, MD. If pt is not established with a provider, would they like to be referred to a provider to establish care? No .   Dental Screening: Recommended annual dental exams for proper oral hygiene  Diabetic Foot Exam:  N/A  Community Resource Referral / Chronic Care Management: CRR required this visit?  No   CCM required this visit?  No     Plan:     I have personally reviewed and noted the following in the patient's chart:   Medical and social history Use of alcohol, tobacco or illicit drugs  Current medications and supplements including opioid prescriptions. Patient is not currently taking opioid prescriptions. Functional ability and status Nutritional status Physical activity Advanced directives List of other physicians Hospitalizations, surgeries, and ER visits in previous 12 months Vitals Screenings to include cognitive, depression, and falls Referrals and appointments  In addition, I have reviewed and discussed with patient certain preventive protocols, quality metrics, and best practice recommendations. A written personalized care plan for preventive services as well as general preventive health recommendations were provided to patient.     Mickeal Needy, LPN   [id number]   After Visit Summary: (MyChart) Due to this being a telephonic visit, the after visit summary with patients personalized plan was offered to patient via MyChart   Nurse Notes: Patient requested an order for bone density scan to rule out osteopenia/osteoporosis.

## 2023-08-13 NOTE — Patient Instructions (Signed)
Jennifer Marquez , Thank you for taking time to come for your Medicare Wellness Visit. I appreciate your ongoing commitment to your health goals. Please review the following plan we discussed and let me know if I can assist you in the future.   Referrals/Orders/Follow-Ups/Clinician Recommendations: No  This is a list of the screening recommended for you and due dates:  Health Maintenance  Topic Date Due   DTaP/Tdap/Td vaccine (2 - Td or Tdap) 09/01/2022   COVID-19 Vaccine (3 - 2023-24 season) 06/08/2023   Cologuard (Stool DNA test)  05/02/2024   Medicare Annual Wellness Visit  08/12/2024   Mammogram  03/12/2025   Pneumonia Vaccine  Completed   Flu Shot  Completed   DEXA scan (bone density measurement)  Completed   Hepatitis C Screening  Completed   Zoster (Shingles) Vaccine  Completed   HPV Vaccine  Aged Out    Advanced directives: No; Patient has paperwork.  Next Medicare Annual Wellness Visit scheduled for next year: Yes   +

## 2023-08-18 ENCOUNTER — Encounter: Payer: Medicare Other | Admitting: Internal Medicine

## 2023-08-26 ENCOUNTER — Encounter: Payer: Self-pay | Admitting: Internal Medicine

## 2023-08-26 ENCOUNTER — Ambulatory Visit (INDEPENDENT_AMBULATORY_CARE_PROVIDER_SITE_OTHER): Payer: Medicare Other | Admitting: Internal Medicine

## 2023-08-26 VITALS — BP 126/92 | HR 75 | Temp 98.2°F | Ht 62.0 in | Wt 148.0 lb

## 2023-08-26 DIAGNOSIS — N951 Menopausal and female climacteric states: Secondary | ICD-10-CM

## 2023-08-26 DIAGNOSIS — R739 Hyperglycemia, unspecified: Secondary | ICD-10-CM

## 2023-08-26 DIAGNOSIS — E7800 Pure hypercholesterolemia, unspecified: Secondary | ICD-10-CM | POA: Diagnosis not present

## 2023-08-26 DIAGNOSIS — Z0000 Encounter for general adult medical examination without abnormal findings: Secondary | ICD-10-CM

## 2023-08-26 DIAGNOSIS — Z720 Tobacco use: Secondary | ICD-10-CM

## 2023-08-26 LAB — LIPID PANEL
Cholesterol: 144 mg/dL (ref 0–200)
HDL: 45.1 mg/dL (ref >39.00)
LDL Cholesterol: 75 mg/dL (ref 0–99)
NonHDL: 98.86
Total CHOL/HDL Ratio: 3
Triglycerides: 120 mg/dL (ref 0.0–149.0)
VLDL: 24 mg/dL (ref 0.0–40.0)

## 2023-08-26 LAB — CBC
HCT: 43.6 % (ref 36.0–46.0)
Hemoglobin: 14.4 g/dL (ref 12.0–15.0)
MCHC: 33 g/dL (ref 30.0–36.0)
MCV: 93.1 fL (ref 78.0–100.0)
Platelets: 264 10*3/uL (ref 150.0–400.0)
RBC: 4.68 Mil/uL (ref 3.87–5.11)
RDW: 13.9 % (ref 11.5–15.5)
WBC: 7 10*3/uL (ref 4.0–10.5)

## 2023-08-26 LAB — VITAMIN D 25 HYDROXY (VIT D DEFICIENCY, FRACTURES): VITD: 48.37 ng/mL (ref 30.00–100.00)

## 2023-08-26 LAB — COMPREHENSIVE METABOLIC PANEL
ALT: 13 U/L (ref 0–35)
AST: 14 U/L (ref 0–37)
Albumin: 4.3 g/dL (ref 3.5–5.2)
Alkaline Phosphatase: 87 U/L (ref 39–117)
BUN: 8 mg/dL (ref 6–23)
CO2: 28 meq/L (ref 19–32)
Calcium: 9.3 mg/dL (ref 8.4–10.5)
Chloride: 107 meq/L (ref 96–112)
Creatinine, Ser: 0.73 mg/dL (ref 0.40–1.20)
GFR: 81.39 mL/min (ref >60.00)
Glucose, Bld: 86 mg/dL (ref 70–99)
Potassium: 4.2 meq/L (ref 3.5–5.1)
Sodium: 142 meq/L (ref 135–145)
Total Bilirubin: 0.6 mg/dL (ref 0.2–1.2)
Total Protein: 6.8 g/dL (ref 6.0–8.3)

## 2023-08-26 LAB — HEMOGLOBIN A1C: Hgb A1c MFr Bld: 6.1 % (ref 4.6–6.5)

## 2023-08-26 MED ORDER — GABAPENTIN 300 MG PO CAPS: ORAL_CAPSULE | ORAL | 3 refills | Status: DC

## 2023-08-26 MED ORDER — ATORVASTATIN CALCIUM 40 MG PO TABS: 40.0000 mg | ORAL_TABLET | Freq: Every day | ORAL | 2 refills | Status: DC

## 2023-08-26 NOTE — Assessment & Plan Note (Signed)
Flu shot up to date. Pneumonia complete. Shingrix complete. Tetanus due at pharmacy. Cologuard due next year. Mammogram due 2025, pap smear aged out and dexa due 2025. Counseled about sun safety and mole surveillance. Counseled about the dangers of distracted driving. Given 10 year screening recommendations.

## 2023-08-26 NOTE — Progress Notes (Signed)
   Subjective:   Patient ID: Jennifer Marquez, female    DOB: 04/05/1951, 72 y.o.   MRN: [id number]  HPI The patient is here for physical.  PMH, [employer] [facility] [employer] [facility], social history reviewed and updated  Review of Systems  Constitutional: Negative.   HENT: Negative.    Eyes: Negative.   Respiratory:  Negative for cough, chest tightness and shortness of breath.   Cardiovascular:  Negative for chest pain, palpitations and leg swelling.  Gastrointestinal:  Negative for abdominal distention, abdominal pain, constipation, diarrhea, nausea and vomiting.  Musculoskeletal: Negative.   Skin: Negative.   Neurological: Negative.   Psychiatric/Behavioral: Negative.      Objective:  Physical Exam Constitutional:      Appearance: She is well-developed.  HENT:     Head: Normocephalic and atraumatic.  Cardiovascular:     Rate and Rhythm: Normal rate and regular rhythm.  Pulmonary:     Effort: Pulmonary effort is normal. No respiratory distress.     Breath sounds: Normal breath sounds. No wheezing or rales.  Abdominal:     General: Bowel sounds are normal. There is no distension.     Palpations: Abdomen is soft.     Tenderness: There is no abdominal tenderness. There is no rebound.  Musculoskeletal:     Cervical back: Normal range of motion.  Skin:    General: Skin is warm and dry.  Neurological:     Mental Status: She is alert and oriented to person, place, and time.     Coordination: Coordination normal.     Vitals:   08/26/23 0930 08/26/23 0941  BP: (!) 126/92 (!) 126/92  Pulse: 75   Temp: 98.2 F (36.8 C)   TempSrc: Oral   SpO2: 92%   Weight: 148 lb (67.1 kg)   Height: 5\' 2"  (1.575 m)     Assessment & Plan:

## 2023-08-26 NOTE — Assessment & Plan Note (Signed)
Checking lipid panel and adjust lipitor 40 mg daily as needed for LDL <100 goal.

## 2023-08-26 NOTE — Assessment & Plan Note (Signed)
Counseled to quit smoking and offered lung cancer screening which she declines. She is unable to make attempt today. Time spent counseling about tobacco usage: 6 minutes. I have asked about smoking and is smoking same as usual. The patient is advised to quit. The patient is not willing to quit. They would like to try to quit in the next 3-6 months. We will follow up with them in 6-12 months.

## 2023-08-26 NOTE — Patient Instructions (Signed)
Think about the lung cancer screening.

## 2024-03-15 ENCOUNTER — Telehealth: Admitting: Family Medicine

## 2024-03-15 DIAGNOSIS — R0602 Shortness of breath: Secondary | ICD-10-CM

## 2024-03-15 DIAGNOSIS — R051 Acute cough: Secondary | ICD-10-CM

## 2024-03-15 NOTE — Progress Notes (Signed)
  Thank you for the details you included in the comment boxes. Those details are very helpful in determining the best course of treatment for you and help us  to provide the best care. Because you are having some shortness of breath, we recommend that you schedule a Virtual Urgent Care video visit in order for the provider to better assess what is going on.  The provider will be able to give you a more accurate diagnosis and treatment plan if we can more freely discuss your symptoms and with the addition of a virtual examination.   If you change your visit to a video visit, we will bill your insurance (similar to an office visit) and you will not be charged for this e-Visit. You will be able to stay at home and speak with the first available [facility] advanced practice provider. The link to do a video visit is in the drop down Menu tab of your Welcome screen in MyChart.

## 2024-04-01 ENCOUNTER — Other Ambulatory Visit: Payer: Medicare Other

## 2024-04-12 ENCOUNTER — Other Ambulatory Visit: Payer: Self-pay | Admitting: Internal Medicine

## 2024-04-12 DIAGNOSIS — Z1231 Encounter for screening mammogram for malignant neoplasm of breast: Secondary | ICD-10-CM

## 2024-04-15 DIAGNOSIS — H25813 Combined forms of age-related cataract, bilateral: Secondary | ICD-10-CM | POA: Diagnosis not present

## 2024-04-23 ENCOUNTER — Ambulatory Visit
Admission: RE | Admit: 2024-04-23 | Discharge: 2024-04-23 | Disposition: A | Discharge status: Home / Self Care | Source: Ambulatory Visit | Attending: Internal Medicine | Admitting: Internal Medicine

## 2024-04-23 DIAGNOSIS — Z1231 Encounter for screening mammogram for malignant neoplasm of breast: Secondary | ICD-10-CM | POA: Diagnosis not present

## 2024-05-05 ENCOUNTER — Other Ambulatory Visit: Payer: Self-pay | Admitting: Internal Medicine

## 2024-05-05 DIAGNOSIS — E7800 Pure hypercholesterolemia, unspecified: Secondary | ICD-10-CM

## 2024-06-20 ENCOUNTER — Other Ambulatory Visit: Payer: Self-pay | Admitting: Internal Medicine

## 2024-06-20 DIAGNOSIS — N951 Menopausal and female climacteric states: Secondary | ICD-10-CM

## 2024-08-13 ENCOUNTER — Ambulatory Visit (INDEPENDENT_AMBULATORY_CARE_PROVIDER_SITE_OTHER)

## 2024-08-13 VITALS — Ht 62.0 in | Wt 148.0 lb

## 2024-08-13 DIAGNOSIS — Z0000 Encounter for general adult medical examination without abnormal findings: Secondary | ICD-10-CM

## 2024-08-13 DIAGNOSIS — F17200 Nicotine dependence, unspecified, uncomplicated: Secondary | ICD-10-CM

## 2024-08-13 DIAGNOSIS — Z1211 Encounter for screening for malignant neoplasm of colon: Secondary | ICD-10-CM

## 2024-08-13 DIAGNOSIS — Z800 Family history of malignant neoplasm of digestive organs: Secondary | ICD-10-CM

## 2024-08-13 NOTE — Progress Notes (Addendum)
 Subjective:   Jennifer Marquez is a 73 y.o. female who presents for a Medicare Annual Wellness Visit.  I connected with  Jennifer Marquez on 08/13/24 by a audio enabled telemedicine application and verified that I am speaking with the correct person using two identifiers.  Patient Location: Home  Provider Location: Office/Clinic  Persons Participating in Visit: Patient.  I discussed the limitations of evaluation and management by telemedicine. The patient expressed understanding and agreed to proceed.  Vital Signs: Because this visit was a virtual/telehealth visit, some criteria may be missing or patient reported. Any vitals not documented were not able to be obtained and vitals that have been documented are patient reported.   Allergies (verified) Patient has no known allergies.   History: Past Medical History:  Diagnosis Date   Hyperlipidemia    Prediabetes    Past Surgical History:  Procedure Laterality Date   BREAST BIOPSY Right 2016   benign   TUBAL LIGATION     Family History  Problem Relation Age of Onset   Hypertension Mother    Stroke Mother 59       TIA/CVA   Cancer Father 25       esophageal cancer   Cancer Brother 43       colon cancer   Heart disease Brother    COPD Brother    Social History   Occupational History   Occupation: retired  Tobacco Use   Smoking status: Every Day    Current packs/day: 0.50    Average packs/day: 0.7 packs/day for 45.8 years (33.7 ttl pk-yrs)    Types: Cigarettes    Start date: 10/07/1978   Smokeless tobacco: Never   Tobacco comments:    would like to discuss  Vaping Use   Vaping status: Never Used  Substance and Sexual Activity   Alcohol use: No   Drug use: No   Sexual activity: Not Currently    Birth control/protection: Post-menopausal, Surgical   Tobacco Counseling Ready to quit: No Counseling given: Yes Tobacco comments: would like to discuss  SDOH Screenings   Food Insecurity: No Food Insecurity  (08/13/2024)  Housing: Low Risk  (08/13/2024)  Transportation Needs: No Transportation Needs (08/13/2024)  Utilities: Not At Risk (08/13/2024)  Alcohol Screen: Low Risk  (08/14/2023)  Depression (PHQ2-9): Low Risk  (08/13/2024)  Financial Resource Strain: Low Risk  (08/10/2024)  Physical Activity: Insufficiently Active (08/13/2024)  Social Connections: Moderately Integrated (08/13/2024)  Stress: Stress Concern Present (08/13/2024)  Tobacco Use: High Risk (08/13/2024)  Health Literacy: Adequate Health Literacy (08/13/2024)   Depression Screen    08/13/2024   11:39 AM 08/13/2023    4:08 PM 02/17/2023   10:50 AM 08/19/2022    1:03 PM 04/30/2022    7:40 AM 08/06/2021   12:28 PM 03/21/2021    8:52 AM  PHQ 2/9 Scores  PHQ - 2 Score 0 0 0 0 0 0 0  PHQ- 9 Score 1 0  0  0  0        Data saved with a previous flowsheet row definition     Goals Addressed               This Visit's Progress     Patient Stated (pt-stated)        Patient stated she plans to continue walking        Visit info / Clinical Intake: Medicare Wellness Visit Type:: Subsequent Annual Wellness Visit Medicare Wellness Visit Mode:: Telephone If telephone:: video declined  If telephone or video:: vitals recorded from last visit Interpreter Needed?: No Pre-visit prep was completed: yes AWV questionnaire completed by patient prior to visit?: yes Date:: 08/10/24 Living arrangements:: lives with spouse/significant other Patient's Overall Health Status Rating: very good Typical amount of pain: none Does pain affect daily life?: no Are you currently prescribed opioids?: no  Dietary Habits and Nutritional Risks How many meals a day?: 2 Eats fruit and vegetables daily?: yes Most meals are obtained by: preparing own meals In the last 2 weeks, have you had any of the following?: -- (none) Diabetic:: no  Functional Status Activities of Daily Living (to include ambulation/medication): Independent Ambulation: Independent  with device- listed below Home Assistive Devices/Equipment: Eyeglasses Medication Administration: Independent Home Management: Independent Manage your own finances?: yes Primary transportation is: driving Concerns about vision?: no *vision screening is required for WTM* Concerns about hearing?: no  Fall Screening Falls in the past year?: 0 Number of falls in past year: 0 Was there an injury with Fall?: 0 Fall Risk Category Calculator: 0 Patient Fall Risk Level: Low Fall Risk  Fall Risk Patient at Risk for Falls Due to: No Fall Risks Fall risk Follow up: Falls evaluation completed; Falls prevention discussed  Home and Transportation Safety: All rugs have non-skid backing?: yes All stairs or steps have railings?: (!) no Grab bars in the bathtub or shower?: (!) no Have non-skid surface in bathtub or shower?: yes Good home lighting?: yes Regular seat belt use?: yes Hospital stays in the last year:: no  Cognitive Assessment Difficulty concentrating, remembering, or making decisions? : no Will 6CIT or Mini Cog be Completed: yes What year is it?: 0 points What month is it?: 0 points Give patient an address phrase to remember (5 components): [address] [place], [place] About what time is it?: 0 points Count backwards from 20 to 1: 0 points Say the months of the year in reverse: 0 points Repeat the address phrase from earlier: 2 points (Drive) 6 CIT Score: 2 points  Advance Directives (For Healthcare) Does Patient Have a Medical Advance Directive?: No Would patient like information on creating a medical advance directive?: No - Patient declined  Reviewed/Updated  Reviewed/Updated: All        Objective:    Today's Vitals   08/13/24 1133  Weight: 148 lb (67.1 kg)  Height: 5' 2 (1.575 m)   Body mass index is 27.07 kg/m.  Current Medications (verified) Outpatient Encounter Medications as of 08/13/2024  Medication Sig   atorvastatin  (LIPITOR) 40 MG tablet TAKE 1  TABLET BY MOUTH DAILY   gabapentin  (NEURONTIN ) 300 MG capsule TAKE 1 TO 2 CAPSULES BY MOUTH AT BEDTIME   No facility-administered encounter medications on file as of 08/13/2024.   Hearing/Vision screen Hearing Screening - Comments:: Denies hearing difficulties   Vision Screening - Comments:: Wears rx glasses - up to date with routine eye exams with [facility] Immunizations and Health Maintenance Health Maintenance  Topic Date Due   Lung Cancer Screening  Never done   DTaP/Tdap/Td (2 - Td or Tdap) 09/01/2022   Fecal DNA (Cologuard)  05/02/2024   COVID-19 Vaccine (3 - 2025-26 season) 06/07/2024   Medicare Annual Wellness (AWV)  08/13/2025   Mammogram  04/23/2026   Pneumococcal Vaccine: 50+ Years  Completed   Influenza Vaccine  Completed   DEXA SCAN  Completed   Hepatitis C Screening  Completed   Zoster Vaccines- Shingrix  Completed   Meningococcal B Vaccine  Aged Out  Assessment/Plan:  This is a routine wellness examination for [facility].  Patient Care Team: Rollene Almarie LABOR, MD as PCP - General (Internal Medicine) [facility] (Ophthalmology)  I have personally reviewed and noted the following in the patient's chart:   Medical and social history Use of alcohol, tobacco or illicit drugs  Current medications and supplements including opioid prescriptions. Functional ability and status Nutritional status Physical activity Advanced directives List of other physicians Hospitalizations, surgeries, and ER visits in previous 12 months Vitals Screenings to include cognitive, depression, and falls Referrals and appointments  Orders Placed This Encounter  Procedures   Ambulatory Referral for Lung Cancer Scre    Referral Priority:   Routine    Referral Type:   Consultation    Referral Reason:   Specialty Services Required    Referred to Provider:   Ruthell Lauraine FALCON, NP    Number of Visits Requested:   1   Ambulatory referral to Gastroenterology     Referral Priority:   Routine    Referral Type:   Consultation    Referral Reason:   Specialty Services Required    Referred to Provider:   Leigh Elspeth SQUIBB, MD    Number of Visits Requested:   1   In addition, I have reviewed and discussed with patient certain preventive protocols, quality metrics, and best practice recommendations. A written personalized care plan for preventive services as well as general preventive health recommendations were provided to patient.   Verdie CHRISTELLA Saba, CMA   08/13/2024   Return in 1 year (on 08/13/2025).  After Visit Summary: (MyChart) Due to this being a telephonic visit, the after visit summary with patients personalized plan was offered to patient via MyChart   Nurse Notes: Scheduled 2026 AWV/CPE appts.  GI referral for a repeat Colonoscopy done (pt is aware) due to Family Hx of Colon Cancer.

## 2024-08-13 NOTE — Patient Instructions (Addendum)
 Jennifer Marquez,  Thank you for taking the time for your Medicare Wellness Visit. I appreciate your continued commitment to your health goals. Please review the care plan we discussed, and feel free to reach out if I can assist you further.  Please note that Annual Wellness Visits do not include a physical exam. Some assessments may be limited, especially if the visit was conducted virtually. If needed, we may recommend an in-person follow-up with your provider.  Ongoing Care Seeing your primary care provider every 3 to 6 months helps us  monitor your health and provide consistent, personalized care.   Referrals If a referral was made during today's visit and you haven't received any updates within two weeks, please contact the referred provider directly to check on the status.  Recommended Screenings:  Health Maintenance  Topic Date Due   Screening for Lung Cancer  Never done   DTaP/Tdap/Td vaccine (2 - Td or Tdap) 09/01/2022   Cologuard (Stool DNA test)  05/02/2024   COVID-19 Vaccine (3 - 2025-26 season) 06/07/2024   Medicare Annual Wellness Visit  08/13/2025   Breast Cancer Screening  04/23/2026   Pneumococcal Vaccine for age over 89  Completed   Flu Shot  Completed   DEXA scan (bone density measurement)  Completed   Hepatitis C Screening  Completed   Zoster (Shingles) Vaccine  Completed   Meningitis B Vaccine  Aged Out       08/13/2023    4:09 PM  Advanced Directives  Does Patient Have a Medical Advance Directive? No  Would patient like information on creating a medical advance directive? No - Patient declined    Vision: Annual vision screenings are recommended for early detection of glaucoma, cataracts, and diabetic retinopathy. These exams can also reveal signs of chronic conditions such as diabetes and high blood pressure.  Dental: Annual dental screenings help detect early signs of oral cancer, gum disease, and other conditions linked to overall health, including heart disease  and diabetes.

## 2024-08-13 NOTE — Addendum Note (Signed)
 Addended by: Bianna Haran M on: 08/13/2024 03:32 PM   Modules accepted: Orders

## 2024-08-16 NOTE — Progress Notes (Signed)
 Subjective:   Jennifer Marquez is a 73 y.o. female who presents for a Medicare Annual Wellness Visit.  I connected with  RICKEY FARRIER on 08/16/24 by a audio enabled telemedicine application and verified that I am speaking with the correct person using two identifiers.  Patient Location: Home  Provider Location: Office/Clinic  Persons Participating in Visit: Patient.  I discussed the limitations of evaluation and management by telemedicine. The patient expressed understanding and agreed to proceed.  Vital Signs: Because this visit was a virtual/telehealth visit, some criteria may be missing or patient reported. Any vitals not documented were not able to be obtained and vitals that have been documented are patient reported.   Allergies (verified) Patient has no known allergies.   History: Past Medical History:  Diagnosis Date   Hyperlipidemia    Prediabetes    Past Surgical History:  Procedure Laterality Date   BREAST BIOPSY Right 2016   benign   TUBAL LIGATION     Family History  Problem Relation Age of Onset   Hypertension Mother    Stroke Mother 67       TIA/CVA   Cancer Father 36       esophageal cancer   Cancer Brother 66       colon cancer   Heart disease Brother    COPD Brother    Social History   Occupational History   Occupation: retired  Tobacco Use   Smoking status: Every Day    Current packs/day: 0.50    Average packs/day: 0.7 packs/day for 45.9 years (33.7 ttl pk-yrs)    Types: Cigarettes    Start date: 10/07/1978   Smokeless tobacco: Never   Tobacco comments:    would like to discuss  Vaping Use   Vaping status: Never Used  Substance and Sexual Activity   Alcohol use: No   Drug use: No   Sexual activity: Not Currently    Birth control/protection: Post-menopausal, Surgical  Colonoscopy status: Pt mentioned that her brother had Colon Cancer 14yrs ago and did a Colonoscopy years ago and results were Negative - no polyps. She's done at  least 2 Cologuards that were Negative.    Tobacco Counseling Ready to quit: No Counseling given: Yes Tobacco comments: would like to discuss  SDOH Screenings   Food Insecurity: No Food Insecurity (08/13/2024)  Housing: Low Risk  (08/13/2024)  Transportation Needs: No Transportation Needs (08/13/2024)  Utilities: Not At Risk (08/13/2024)  Alcohol Screen: Low Risk  (08/14/2023)  Depression (PHQ2-9): Low Risk  (08/13/2024)  Financial Resource Strain: Low Risk  (08/10/2024)  Physical Activity: Insufficiently Active (08/13/2024)  Social Connections: Moderately Integrated (08/13/2024)  Stress: Stress Concern Present (08/13/2024)  Tobacco Use: High Risk (08/13/2024)  Health Literacy: Adequate Health Literacy (08/13/2024)   Depression Screen    08/13/2024   11:39 AM 08/13/2023    4:08 PM 02/17/2023   10:50 AM 08/19/2022    1:03 PM 04/30/2022    7:40 AM 08/06/2021   12:28 PM 03/21/2021    8:52 AM  PHQ 2/9 Scores  PHQ - 2 Score 0 0 0 0 0 0 0  PHQ- 9 Score 1 0  0  0  0        Data saved with a previous flowsheet row definition     Goals Addressed               This Visit's Progress     Patient Stated (pt-stated)  Patient stated she plans to continue walking        Visit info / Clinical Intake: Medicare Wellness Visit Type:: Subsequent Annual Wellness Visit Medicare Wellness Visit Mode:: Telephone If telephone:: video declined If telephone or video:: vitals recorded from last visit Interpreter Needed?: No Pre-visit prep was completed: yes AWV questionnaire completed by patient prior to visit?: yes Date:: 08/10/24 Living arrangements:: lives with spouse/significant other Patient's Overall Health Status Rating: very good Typical amount of pain: none Does pain affect daily life?: no Are you currently prescribed opioids?: no  Dietary Habits and Nutritional Risks How many meals a day?: 2 Eats fruit and vegetables daily?: yes Most meals are obtained by: preparing own  meals In the last 2 weeks, have you had any of the following?: -- (none) Diabetic:: no  Functional Status Activities of Daily Living (to include ambulation/medication): Independent Ambulation: Independent with device- listed below Home Assistive Devices/Equipment: Eyeglasses Medication Administration: Independent Home Management: Independent Manage your own finances?: yes Primary transportation is: driving Concerns about vision?: no *vision screening is required for WTM* Concerns about hearing?: no  Fall Screening Falls in the past year?: 0 Number of falls in past year: 0 Was there an injury with Fall?: 0 Fall Risk Category Calculator: 0 Patient Fall Risk Level: Low Fall Risk  Fall Risk Patient at Risk for Falls Due to: No Fall Risks Fall risk Follow up: Falls evaluation completed; Falls prevention discussed  Home and Transportation Safety: All rugs have non-skid backing?: yes All stairs or steps have railings?: (!) no Grab bars in the bathtub or shower?: (!) no Have non-skid surface in bathtub or shower?: yes Good home lighting?: yes Regular seat belt use?: yes Hospital stays in the last year:: no  Cognitive Assessment Difficulty concentrating, remembering, or making decisions? : no Will 6CIT or Mini Cog be Completed: yes What year is it?: 0 points What month is it?: 0 points Give patient an address phrase to remember (5 components): [address] [place], [place] About what time is it?: 0 points Count backwards from 20 to 1: 0 points Say the months of the year in reverse: 0 points Repeat the address phrase from earlier: 2 points (Drive) 6 CIT Score: 2 points  Advance Directives (For Healthcare) Does Patient Have a Medical Advance Directive?: No Would patient like information on creating a medical advance directive?: No - Patient declined  Reviewed/Updated  Reviewed/Updated: All        Objective:    Today's Vitals   08/13/24 1133  Weight: 148 lb (67.1 kg)   Height: 5' 2 (1.575 m)   Body mass index is 27.07 kg/m.  Current Medications (verified) Outpatient Encounter Medications as of 08/13/2024  Medication Sig   atorvastatin  (LIPITOR) 40 MG tablet TAKE 1 TABLET BY MOUTH DAILY   gabapentin  (NEURONTIN ) 300 MG capsule TAKE 1 TO 2 CAPSULES BY MOUTH AT BEDTIME   No facility-administered encounter medications on file as of 08/13/2024.   Hearing/Vision screen Hearing Screening - Comments:: Denies hearing difficulties   Vision Screening - Comments:: Wears rx glasses - up to date with routine eye exams with [facility] Immunizations and Health Maintenance Health Maintenance  Topic Date Due   Lung Cancer Screening  Never done   DTaP/Tdap/Td (2 - Td or Tdap) 09/01/2022   Fecal DNA (Cologuard)  05/02/2024   COVID-19 Vaccine (3 - 2025-26 season) 06/07/2024   Medicare Annual Wellness (AWV)  08/13/2025   Mammogram  04/23/2026   Pneumococcal Vaccine: 50+ Years  Completed  Influenza Vaccine  Completed   DEXA SCAN  Completed   Hepatitis C Screening  Completed   Zoster Vaccines- Shingrix  Completed   Meningococcal B Vaccine  Aged Out        Assessment/Plan:  This is a routine wellness examination for [facility].  Patient Care Team: Rollene Almarie LABOR, MD as PCP - General (Internal Medicine) [facility] (Ophthalmology)  I have personally reviewed and noted the following in the patient's chart:   Medical and social history Use of alcohol, tobacco or illicit drugs  Current medications and supplements including opioid prescriptions. Functional ability and status Nutritional status Physical activity Advanced directives List of other physicians Hospitalizations, surgeries, and ER visits in previous 12 months Vitals Screenings to include cognitive, depression, and falls Referrals and appointments  Orders Placed This Encounter  Procedures   Ambulatory Referral for Lung Cancer Scre    Referral Priority:   Routine     Referral Type:   Consultation    Referral Reason:   Specialty Services Required    Referred to Provider:   Ruthell Lauraine FALCON, NP    Number of Visits Requested:   1   Ambulatory referral to Gastroenterology    Referral Priority:   Routine    Referral Type:   Consultation    Referral Reason:   Specialty Services Required    Referred to Provider:   Leigh Elspeth SQUIBB, MD    Number of Visits Requested:   1   In addition, I have reviewed and discussed with patient certain preventive protocols, quality metrics, and best practice recommendations. A written personalized care plan for preventive services as well as general preventive health recommendations were provided to patient.   Verdie CHRISTELLA Saba, CMA   08/16/2024   Return in 1 year (on 08/13/2025).  After Visit Summary: (MyChart) Due to this being a telephonic visit, the after visit summary with patients personalized plan was offered to patient via MyChart   Nurse Notes: Scheduled 2026 AWV/CPE appts.  GI referral for a repeat Colonoscopy done (pt is aware) due to Family Hx of Colon Cancer.

## 2024-08-18 NOTE — Addendum Note (Signed)
 Addended by: GERALDENE VERDIE HERO on: 08/18/2024 12:19 PM   Modules accepted: Orders

## 2024-08-18 NOTE — Progress Notes (Signed)
 Subjective:   Jennifer Marquez is a 73 y.o. female who presents for a Medicare Annual Wellness Visit.  I connected with  Jennifer Marquez on 08/18/24 by a audio enabled telemedicine application and verified that I am speaking with the correct person using two identifiers.  Patient Location: Home  Provider Location: Office/Clinic  Persons Participating in Visit: Patient.  I discussed the limitations of evaluation and management by telemedicine. The patient expressed understanding and agreed to proceed.  Vital Signs: Because this visit was a virtual/telehealth visit, some criteria may be missing or patient reported. Any vitals not documented were not able to be obtained and vitals that have been documented are patient reported.   Allergies (verified) Patient has no known allergies.   History: Past Medical History:  Diagnosis Date   Hyperlipidemia    Prediabetes    Past Surgical History:  Procedure Laterality Date   BREAST BIOPSY Right 2016   benign   TUBAL LIGATION     Family History  Problem Relation Age of Onset   Hypertension Mother    Stroke Mother 92       TIA/CVA   Cancer Father 82       esophageal cancer   Cancer Brother 39       colon cancer   Heart disease Brother    COPD Brother    Social History   Occupational History   Occupation: retired  Tobacco Use   Smoking status: Every Day    Current packs/day: 0.50    Average packs/day: 0.7 packs/day for 45.9 years (33.7 ttl pk-yrs)    Types: Cigarettes    Start date: 10/07/1978   Smokeless tobacco: Never   Tobacco comments:    would like to discuss  Vaping Use   Vaping status: Never Used  Substance and Sexual Activity   Alcohol use: No   Drug use: No   Sexual activity: Not Currently    Birth control/protection: Post-menopausal, Surgical  Colonoscopy status: Pt mentioned that her brother had Colon Cancer 23yrs ago and did a Colonoscopy years ago and results were Negative - no polyps. She's done at  least 2 Cologuards that were Negative.    Tobacco Counseling Ready to quit: No Counseling given: Yes Tobacco comments: would like to discuss  SDOH Screenings   Food Insecurity: No Food Insecurity (08/13/2024)  Housing: Low Risk  (08/13/2024)  Transportation Needs: No Transportation Needs (08/13/2024)  Utilities: Not At Risk (08/13/2024)  Alcohol Screen: Low Risk  (08/14/2023)  Depression (PHQ2-9): Low Risk  (08/13/2024)  Financial Resource Strain: Low Risk  (08/10/2024)  Physical Activity: Insufficiently Active (08/13/2024)  Social Connections: Moderately Integrated (08/13/2024)  Stress: Stress Concern Present (08/13/2024)  Tobacco Use: High Risk (08/13/2024)  Health Literacy: Adequate Health Literacy (08/13/2024)   Depression Screen    08/13/2024   11:39 AM 08/13/2023    4:08 PM 02/17/2023   10:50 AM 08/19/2022    1:03 PM 04/30/2022    7:40 AM 08/06/2021   12:28 PM 03/21/2021    8:52 AM  PHQ 2/9 Scores  PHQ - 2 Score 0 0 0 0 0 0 0  PHQ- 9 Score 1 0  0  0  0        Data saved with a previous flowsheet row definition     Goals Addressed               This Visit's Progress     Patient Stated (pt-stated)  Patient stated she plans to continue walking        Visit info / Clinical Intake: Medicare Wellness Visit Type:: Subsequent Annual Wellness Visit Medicare Wellness Visit Mode:: Telephone If telephone:: video declined Because this visit was a virtual/telehealth visit:: vitals recorded from last visit Interpreter Needed?: No Pre-visit prep was completed: yes AWV questionnaire completed by patient prior to visit?: yes Date:: 08/10/24 Living arrangements:: lives with spouse/significant other Patient's Overall Health Status Rating: very good Typical amount of pain: none Does pain affect daily life?: no Are you currently prescribed opioids?: no  Dietary Habits and Nutritional Risks How many meals a day?: 2 Eats fruit and vegetables daily?: yes Most meals are  obtained by: preparing own meals In the last 2 weeks, have you had any of the following?: -- (none) Diabetic:: no  Functional Status Activities of Daily Living (to include ambulation/medication): Independent Ambulation: Independent with device- listed below Home Assistive Devices/Equipment: Eyeglasses Medication Administration: Independent Home Management: Independent Manage your own finances?: yes Primary transportation is: driving Concerns about vision?: no *vision screening is required for WTM* Concerns about hearing?: no  Fall Screening Falls in the past year?: 0 Number of falls in past year: 0 Was there an injury with Fall?: 0 Fall Risk Category Calculator: 0 Patient Fall Risk Level: Low Fall Risk  Fall Risk Patient at Risk for Falls Due to: No Fall Risks Fall risk Follow up: Falls evaluation completed; Falls prevention discussed  Home and Transportation Safety: All rugs have non-skid backing?: yes All stairs or steps have railings?: (!) no Grab bars in the bathtub or shower?: (!) no Have non-skid surface in bathtub or shower?: yes Good home lighting?: yes Regular seat belt use?: yes Hospital stays in the last year:: no  Cognitive Assessment Difficulty concentrating, remembering, or making decisions? : no Will 6CIT or Mini Cog be Completed: yes What year is it?: 0 points What month is it?: 0 points Give patient an address phrase to remember (5 components): [address] [place], [place] About what time is it?: 0 points Count backwards from 20 to 1: 0 points Say the months of the year in reverse: 0 points Repeat the address phrase from earlier: 2 points (Drive) 6 CIT Score: 2 points  Advance Directives (For Healthcare) Does Patient Have a Medical Advance Directive?: No Would patient like information on creating a medical advance directive?: No - Patient declined  Reviewed/Updated  Reviewed/Updated: All        Objective:    Today's Vitals   08/13/24 1133   Weight: 148 lb (67.1 kg)  Height: 5' 2 (1.575 m)   Body mass index is 27.07 kg/m.  Current Medications (verified) Outpatient Encounter Medications as of 08/13/2024  Medication Sig   atorvastatin  (LIPITOR) 40 MG tablet TAKE 1 TABLET BY MOUTH DAILY   gabapentin  (NEURONTIN ) 300 MG capsule TAKE 1 TO 2 CAPSULES BY MOUTH AT BEDTIME   No facility-administered encounter medications on file as of 08/13/2024.   Hearing/Vision screen Hearing Screening - Comments:: Denies hearing difficulties   Vision Screening - Comments:: Wears rx glasses - up to date with routine eye exams with [facility] Immunizations and Health Maintenance Health Maintenance  Topic Date Due   Lung Cancer Screening  Never done   DTaP/Tdap/Td (2 - Td or Tdap) 09/01/2022   Fecal DNA (Cologuard)  05/02/2024   COVID-19 Vaccine (3 - 2025-26 season) 06/07/2024   Medicare Annual Wellness (AWV)  08/13/2025   Mammogram  04/23/2026   Pneumococcal Vaccine: 50+  Years  Completed   Influenza Vaccine  Completed   DEXA SCAN  Completed   Hepatitis C Screening  Completed   Zoster Vaccines- Shingrix  Completed   Meningococcal B Vaccine  Aged Out        Assessment/Plan:  This is a routine wellness examination for [facility].  Patient Care Team: Rollene Almarie LABOR, MD as PCP - General (Internal Medicine) [facility] (Ophthalmology)  I have personally reviewed and noted the following in the patient's chart:   Medical and social history Use of alcohol, tobacco or illicit drugs  Current medications and supplements including opioid prescriptions. Functional ability and status Nutritional status Physical activity Advanced directives List of other physicians Hospitalizations, surgeries, and ER visits in previous 12 months Vitals Screenings to include cognitive, depression, and falls Referrals and appointments  Orders Placed This Encounter  Procedures   Cologuard   Ambulatory Referral for Lung Cancer  Scre    Referral Priority:   Routine    Referral Type:   Consultation    Referral Reason:   Specialty Services Required    Referred to Provider:   Ruthell Lauraine FALCON, NP    Number of Visits Requested:   1   In addition, I have reviewed and discussed with patient certain preventive protocols, quality metrics, and best practice recommendations. A written personalized care plan for preventive services as well as general preventive health recommendations were provided to patient.   Jennifer Marquez, CMA   08/18/2024   Return in 1 year (on 08/13/2025).  After Visit Summary: (MyChart) Due to this being a telephonic visit, the after visit summary with patients personalized plan was offered to patient via MyChart   Nurse Notes: Scheduled 2026 AWV/CPE appts.  GI referral for a repeat Colonoscopy done (pt is aware) due to Family Hx of Colon Cancer.    08/18/24 - Pt declined Colonoscopy.  Discussed w/PCP & Cologuard will be ordered (pt has done 2 kits previously).

## 2024-08-20 ENCOUNTER — Telehealth: Payer: Self-pay

## 2024-08-20 DIAGNOSIS — F17210 Nicotine dependence, cigarettes, uncomplicated: Secondary | ICD-10-CM

## 2024-08-20 DIAGNOSIS — Z122 Encounter for screening for malignant neoplasm of respiratory organs: Secondary | ICD-10-CM

## 2024-08-20 DIAGNOSIS — Z87891 Personal history of nicotine dependence: Secondary | ICD-10-CM

## 2024-08-20 NOTE — Telephone Encounter (Signed)
 Lung Cancer Screening Narrative/Criteria Questionnaire (Cigarette Smokers Only- No Cigars/Pipes/vapes)   Jennifer Marquez   SDMV:09/06/2024 at 9:00 am [name]        12/04/1950               LDCT: 09/07/2024 at 11:40 am at GI    73 y.o.   Phone: [phone]  Lung Screening Narrative (confirm age 51-77 yrs Medicare / 50-80 yrs Private pay insurance)   Insurance information:[employer] Medicare   Referring Provider: Rollene, MD   This screening involves an initial phone call with a team member from our program. It is called a shared decision making visit. The initial meeting is required by  insurance and Medicare to make sure you understand the program. This appointment takes about 15-20 minutes to complete. You will complete the screening scan at your scheduled date/time.  This scan takes about 5-10 minutes to complete. You can eat and drink normally before and after the scan.  Criteria questions for Lung Cancer Screening:   Are you a current or former smoker? Current Age began smoking: 73   If you are a former smoker, what year did you quit smoking? Never quit. (within 15 yrs)   To calculate your smoking history, I need an accurate estimate of how many packs of cigarettes you smoked per day and for how many years. (Not just the number of PPD you are now smoking)   Years smoking 39 x Packs per day 1 = Pack years 38   (at least 20 pack yrs)   (Make sure they understand that we need to know how much they have smoked in the past, not just the number of PPD they are smoking now)  Do you have a personal history of cancer?  No    Do you have a family history of cancer? Yes  (cancer type and and relative) Brother with colon cancer.   Are you coughing up blood?  No  Have you had unexplained weight loss of 15 lbs or more in the last 6 months? No  It looks like you meet all criteria.  When would be a good time for us  to schedule you for this screening?   Additional information: N/A

## 2024-08-26 ENCOUNTER — Encounter: Admitting: Internal Medicine

## 2024-08-27 ENCOUNTER — Encounter: Payer: Self-pay | Admitting: Internal Medicine

## 2024-08-27 ENCOUNTER — Ambulatory Visit: Admitting: Internal Medicine

## 2024-08-27 ENCOUNTER — Encounter: Admitting: Internal Medicine

## 2024-08-27 VITALS — BP 110/70 | HR 68 | Temp 98.2°F | Ht 62.0 in | Wt 136.2 lb

## 2024-08-27 DIAGNOSIS — R7303 Prediabetes: Secondary | ICD-10-CM | POA: Diagnosis not present

## 2024-08-27 DIAGNOSIS — F4322 Adjustment disorder with anxiety: Secondary | ICD-10-CM

## 2024-08-27 DIAGNOSIS — Z0000 Encounter for general adult medical examination without abnormal findings: Secondary | ICD-10-CM

## 2024-08-27 DIAGNOSIS — E7800 Pure hypercholesterolemia, unspecified: Secondary | ICD-10-CM | POA: Diagnosis not present

## 2024-08-27 DIAGNOSIS — Z720 Tobacco use: Secondary | ICD-10-CM | POA: Diagnosis not present

## 2024-08-27 DIAGNOSIS — F4320 Adjustment disorder, unspecified: Secondary | ICD-10-CM | POA: Insufficient documentation

## 2024-08-27 LAB — LIPID PANEL
Cholesterol: 144 mg/dL (ref 0–200)
HDL: 56.1 mg/dL (ref >39.00)
LDL Cholesterol: 72 mg/dL (ref 0–99)
NonHDL: 88.37
Total CHOL/HDL Ratio: 3
Triglycerides: 81 mg/dL (ref 0.0–149.0)
VLDL: 16.2 mg/dL (ref 0.0–40.0)

## 2024-08-27 LAB — COMPREHENSIVE METABOLIC PANEL WITH GFR
ALT: 14 U/L (ref 0–35)
AST: 16 U/L (ref 0–37)
Albumin: 4.4 g/dL (ref 3.5–5.2)
Alkaline Phosphatase: 93 U/L (ref 39–117)
BUN: 6 mg/dL (ref 6–23)
CO2: 30 meq/L (ref 19–32)
Calcium: 9.6 mg/dL (ref 8.4–10.5)
Chloride: 104 meq/L (ref 96–112)
Creatinine, Ser: 0.72 mg/dL (ref 0.40–1.20)
GFR: 82.16 mL/min (ref >60.00)
Glucose, Bld: 94 mg/dL (ref 70–99)
Potassium: 4.1 meq/L (ref 3.5–5.1)
Sodium: 142 meq/L (ref 135–145)
Total Bilirubin: 0.7 mg/dL (ref 0.2–1.2)
Total Protein: 7.1 g/dL (ref 6.0–8.3)

## 2024-08-27 LAB — CBC
HCT: 42.1 % (ref 36.0–46.0)
Hemoglobin: 14.1 g/dL (ref 12.0–15.0)
MCHC: 33.6 g/dL (ref 30.0–36.0)
MCV: 90.8 fl (ref 78.0–100.0)
Platelets: 274 K/uL (ref 150.0–400.0)
RBC: 4.64 Mil/uL (ref 3.87–5.11)
RDW: 14 % (ref 11.5–15.5)
WBC: 8.3 K/uL (ref 4.0–10.5)

## 2024-08-27 LAB — HEMOGLOBIN A1C: Hgb A1c MFr Bld: 6 % (ref 4.6–6.5)

## 2024-08-27 MED ORDER — BUSPIRONE HCL 5 MG PO TABS: 5.0000 mg | ORAL_TABLET | Freq: Two times a day (BID) | ORAL | 0 refills | Status: DC

## 2024-08-27 NOTE — Assessment & Plan Note (Signed)
Counseled to quit and unable to make attempt at this time.

## 2024-08-27 NOTE — Assessment & Plan Note (Signed)
 Flu shot up to date. Pneumonia complete. Shingrix complete. Tetanus up to date. Cologuard just returned. Mammogram up to date, pap smear aged out and dexa up to date. Counseled about sun safety and mole surveillance. Counseled about the dangers of distracted driving. Given 10 year screening recommendations.

## 2024-08-27 NOTE — Assessment & Plan Note (Signed)
 Rx buspar  to help with husband diagnosis of dementia and she is struggling with his behavior issues at times. Counseled about resources available for this.

## 2024-08-27 NOTE — Assessment & Plan Note (Signed)
 Checking HgA1c and adjust as needed.

## 2024-08-27 NOTE — Assessment & Plan Note (Signed)
Checking lipid panel and adjust lipitor as needed.  

## 2024-08-27 NOTE — Progress Notes (Signed)
   Subjective:   Patient ID: Jennifer Marquez, female    DOB: 26-Nov-1950, 73 y.o.   MRN: [id number]  The patient is here for physical. Pertinent topics discussed: Discussed the use of AI scribe software for clinical note transcription with the patient, who gave verbal consent to proceed.  History of Present Illness Jennifer Marquez is a 73 year old female who presents for a follow-up visit and lung cancer screening.  She is scheduled for lung cancer screening in a couple of weeks and has recently submitted a Cologuard test, with results expected in one to two weeks. She continues to smoke, although she has not increased her smoking despite experiencing significant stress related to her husband's dementia.  Her husband has dementia, which has been challenging for her. He has a history of heavy beer consumption. He has undergone a brain scan and sees a neurologist. She describes episodes where he becomes combative and verbally abusive, which is distressing for her.  No new chest pain, tightness, or breathing difficulties. She had a head cold about a month ago, which has since gone away. She also received an RSV vaccine recently.  She experiences occasional constipation, which she attributes to dietary factors, but reports no blood in her stool. She takes Tylenol for arthritis-related pain in her hip and shoulder, which provides relief.  She recently got new glasses and reports no changes in her vision or hearing. She engages in regular physical activity by walking her dog for about fifteen minutes, four to five times a day.  She notes some urinary hesitancy in the mornings, where she feels she has finished urinating but then has a little more come out after standing up. She attributes this to age-related changes.  PMH, [facility], social history reviewed and updated  Review of Systems  Constitutional: Negative.   HENT: Negative.    Eyes: Negative.   Respiratory:  Negative for cough, chest  tightness and shortness of breath.   Cardiovascular:  Negative for chest pain, palpitations and leg swelling.  Gastrointestinal:  Negative for abdominal distention, abdominal pain, constipation, diarrhea, nausea and vomiting.  Musculoskeletal: Negative.   Skin: Negative.   Neurological: Negative.   Psychiatric/Behavioral: Negative.      Objective:  Physical Exam Constitutional:      Appearance: She is well-developed.  HENT:     Head: Normocephalic and atraumatic.  Cardiovascular:     Rate and Rhythm: Normal rate and regular rhythm.  Pulmonary:     Effort: Pulmonary effort is normal. No respiratory distress.     Breath sounds: Normal breath sounds. No wheezing or rales.  Abdominal:     General: Bowel sounds are normal. There is no distension.     Palpations: Abdomen is soft.     Tenderness: There is no abdominal tenderness.  Musculoskeletal:     Cervical back: Normal range of motion.  Skin:    General: Skin is warm and dry.  Neurological:     Mental Status: She is alert and oriented to person, place, and time.     Coordination: Coordination normal.     Vitals:   08/27/24 0934  BP: 110/70  Pulse: 68  Temp: 98.2 F (36.8 C)  TempSrc: Oral  SpO2: 98%  Weight: 136 lb 3.2 oz (61.8 kg)  Height: 5' 2 (1.575 m)    Assessment & Plan:

## 2024-08-29 LAB — COLOGUARD: COLOGUARD: NEGATIVE

## 2024-08-30 ENCOUNTER — Ambulatory Visit: Payer: Self-pay | Admitting: Internal Medicine

## 2024-08-31 ENCOUNTER — Other Ambulatory Visit: Payer: Self-pay | Admitting: Internal Medicine

## 2024-08-31 DIAGNOSIS — E7800 Pure hypercholesterolemia, unspecified: Secondary | ICD-10-CM

## 2024-08-31 DIAGNOSIS — N951 Menopausal and female climacteric states: Secondary | ICD-10-CM

## 2024-09-04 ENCOUNTER — Other Ambulatory Visit: Payer: Self-pay | Admitting: Internal Medicine

## 2024-09-06 ENCOUNTER — Encounter: Payer: Self-pay | Admitting: *Deleted

## 2024-09-06 ENCOUNTER — Ambulatory Visit: Admitting: *Deleted

## 2024-09-06 DIAGNOSIS — F17210 Nicotine dependence, cigarettes, uncomplicated: Secondary | ICD-10-CM

## 2024-09-06 NOTE — Progress Notes (Signed)
 Virtual Visit via Telephone Note  I connected with RENATTA SHRIEVES on 09/06/24 at  9:00 AM EST by telephone and verified that I am speaking with the correct person using two identifiers.  Location: Patient: at home Provider: [address], [place], [place], [address]    I discussed the limitations, risks, security and privacy concerns of performing an evaluation and management service by telephone and the availability of in person appointments. I also discussed with the patient that there may be a patient responsible charge related to this service. The patient expressed understanding and agreed to proceed.    Shared Decision Making Visit Lung Cancer Screening Program [phone])   Eligibility: Age 52 y.o. Pack Years Smoking History Calculation 39 (# packs/per year x # years smoked) Recent History of coughing up blood  no Unexplained weight loss? no ( >Than 15 pounds within the last 6 months ) Prior History Lung / other cancer no (Diagnosis within the last 5 years already requiring surveillance chest CT Scans). Smoking Status Current Smoker Former Smokers: Years since quit: n/a  Quit Date: n/a  Visit Components: Discussion included one or more decision making aids. yes Discussion included risk/benefits of screening. yes Discussion included potential follow up diagnostic testing for abnormal scans. yes Discussion included meaning and risk of over diagnosis. yes Discussion included meaning and risk of False Positives. yes Discussion included meaning of total radiation exposure. yes  Counseling Included: Importance of adherence to annual lung cancer LDCT screening. yes Impact of comorbidities on ability to participate in the program. yes Ability and willingness to under diagnostic treatment. yes  Smoking Cessation Counseling: Current Smokers:  Discussed importance of smoking cessation. yes Information about tobacco cessation classes and interventions provided to patient.  yes Patient provided with ticket for LDCT Scan. no Symptomatic Patient. no  Diagnosis Code: Tobacco Use Z72.0 Asymptomatic Patient yes  Smoking/Tobacco Cessation Counseling JAYDAH STAHLE is a current user of tobacco or nicotine  products. She is considering quitting at this time. Counseling provided today addressed the risks of continued use and the benefits of cessation. Discussed tobacco/nicotine  use history, readiness to quit, and evidence-based treatment options including behavioral strategies, support resources, and pharmacologic therapies. Provided encouragement and educational materials on steps and resources to quit smoking. Patient questions were addressed, and follow-up recommended for continued support. Total time spent on counseling: 5 minutes.   Former Smokers:  Discussed the importance of maintaining cigarette abstinence. yes Diagnosis Code: Personal History of Nicotine  Dependence. S12.108 Information about tobacco cessation classes and interventions provided to patient. Yes Patient provided with ticket for LDCT Scan. no Written Order for Lung Cancer Screening with LDCT placed in Epic. Yes (CT Chest Lung Cancer Screening Low Dose W/O CM) [id number] Z12.2-Screening of respiratory organs Z87.891-Personal history of nicotine  dependence   Laneta Speaks, RN

## 2024-09-06 NOTE — Patient Instructions (Signed)

## 2024-09-07 ENCOUNTER — Other Ambulatory Visit

## 2024-09-14 ENCOUNTER — Other Ambulatory Visit

## 2024-09-23 ENCOUNTER — Other Ambulatory Visit

## 2024-10-18 ENCOUNTER — Inpatient Hospital Stay
Admission: RE | Admit: 2024-10-18 | Discharge: 2024-10-18 | Disposition: A | Source: Ambulatory Visit | Attending: Acute Care | Admitting: Acute Care

## 2024-10-18 DIAGNOSIS — F17210 Nicotine dependence, cigarettes, uncomplicated: Secondary | ICD-10-CM

## 2024-10-18 DIAGNOSIS — Z87891 Personal history of nicotine dependence: Secondary | ICD-10-CM

## 2024-10-18 DIAGNOSIS — Z122 Encounter for screening for malignant neoplasm of respiratory organs: Secondary | ICD-10-CM

## 2024-10-26 ENCOUNTER — Other Ambulatory Visit: Payer: Self-pay

## 2024-10-26 DIAGNOSIS — Z122 Encounter for screening for malignant neoplasm of respiratory organs: Secondary | ICD-10-CM

## 2024-10-26 DIAGNOSIS — Z87891 Personal history of nicotine dependence: Secondary | ICD-10-CM

## 2024-10-26 DIAGNOSIS — F17210 Nicotine dependence, cigarettes, uncomplicated: Secondary | ICD-10-CM

## 2025-08-30 ENCOUNTER — Encounter: Admitting: Internal Medicine

## 2025-08-30 ENCOUNTER — Ambulatory Visit
